# Patient Record
Sex: Female | Born: 1960
Health system: Southern US, Community
[De-identification: ages and names within clinical notes are randomized; demographics above are authoritative.]

## PROBLEM LIST (undated history)

## (undated) DIAGNOSIS — J302 Other seasonal allergic rhinitis: Secondary | ICD-10-CM

## (undated) DIAGNOSIS — M353 Polymyalgia rheumatica: Secondary | ICD-10-CM

## (undated) HISTORY — PX: BREAST EXCISIONAL BIOPSY: SUR124

## (undated) HISTORY — DX: Other seasonal allergic rhinitis: J30.2

## (undated) HISTORY — DX: Polymyalgia rheumatica: M35.3

---

## 1980-10-21 HISTORY — PX: RIGHT OOPHORECTOMY: SHX2359

## 1991-10-22 HISTORY — PX: ECTOPIC PREGNANCY SURGERY: SHX613

## 1991-10-22 HISTORY — PX: ABLATION ON ENDOMETRIOSIS: SHX5787

## 1998-10-21 HISTORY — PX: BREAST EXCISIONAL BIOPSY: SUR124

## 1999-01-01 ENCOUNTER — Ambulatory Visit (HOSPITAL_COMMUNITY): Admission: RE | Admit: 1999-01-01 | Discharge: 1999-01-01 | Payer: Self-pay | Admitting: *Deleted

## 1999-11-05 ENCOUNTER — Ambulatory Visit (HOSPITAL_BASED_OUTPATIENT_CLINIC_OR_DEPARTMENT_OTHER): Admission: RE | Admit: 1999-11-05 | Discharge: 1999-11-05 | Payer: Self-pay | Admitting: *Deleted

## 1999-12-14 ENCOUNTER — Other Ambulatory Visit: Admission: RE | Admit: 1999-12-14 | Discharge: 1999-12-14 | Payer: Self-pay | Admitting: Obstetrics and Gynecology

## 2000-02-07 ENCOUNTER — Ambulatory Visit (HOSPITAL_COMMUNITY): Admission: RE | Admit: 2000-02-07 | Discharge: 2000-02-07 | Payer: Self-pay | Admitting: *Deleted

## 2000-05-09 ENCOUNTER — Encounter: Admission: RE | Admit: 2000-05-09 | Discharge: 2000-06-09 | Payer: Self-pay | Admitting: Sports Medicine

## 2000-08-14 ENCOUNTER — Encounter: Admission: RE | Admit: 2000-08-14 | Discharge: 2000-09-25 | Payer: Self-pay | Admitting: Sports Medicine

## 2002-08-31 ENCOUNTER — Ambulatory Visit (HOSPITAL_COMMUNITY): Admission: RE | Admit: 2002-08-31 | Discharge: 2002-08-31 | Payer: Self-pay | Admitting: *Deleted

## 2004-01-20 ENCOUNTER — Ambulatory Visit (HOSPITAL_COMMUNITY): Admission: RE | Admit: 2004-01-20 | Discharge: 2004-01-20 | Payer: Self-pay | Admitting: Obstetrics and Gynecology

## 2004-01-25 ENCOUNTER — Encounter: Admission: RE | Admit: 2004-01-25 | Discharge: 2004-01-25 | Payer: Self-pay | Admitting: Obstetrics and Gynecology

## 2004-03-30 ENCOUNTER — Ambulatory Visit (HOSPITAL_COMMUNITY): Admission: RE | Admit: 2004-03-30 | Discharge: 2004-03-30 | Payer: Self-pay | Admitting: Internal Medicine

## 2004-03-30 HISTORY — PX: COLONOSCOPY: SHX174

## 2005-08-05 ENCOUNTER — Ambulatory Visit (HOSPITAL_COMMUNITY): Admission: RE | Admit: 2005-08-05 | Discharge: 2005-08-05 | Payer: Self-pay | Admitting: Obstetrics and Gynecology

## 2006-09-01 ENCOUNTER — Ambulatory Visit (HOSPITAL_COMMUNITY): Admission: RE | Admit: 2006-09-01 | Discharge: 2006-09-01 | Payer: Self-pay | Admitting: Obstetrics and Gynecology

## 2007-08-17 ENCOUNTER — Encounter: Admission: RE | Admit: 2007-08-17 | Discharge: 2007-08-17 | Payer: Self-pay | Admitting: Occupational Medicine

## 2007-09-01 ENCOUNTER — Encounter: Admission: RE | Admit: 2007-09-01 | Discharge: 2007-11-30 | Payer: Self-pay | Admitting: Occupational Medicine

## 2007-09-07 ENCOUNTER — Ambulatory Visit (HOSPITAL_COMMUNITY): Admission: RE | Admit: 2007-09-07 | Discharge: 2007-09-07 | Payer: Self-pay | Admitting: Sports Medicine

## 2007-09-30 ENCOUNTER — Ambulatory Visit (HOSPITAL_COMMUNITY): Admission: RE | Admit: 2007-09-30 | Discharge: 2007-09-30 | Payer: Self-pay | Admitting: Obstetrics and Gynecology

## 2007-10-08 ENCOUNTER — Encounter: Admission: RE | Admit: 2007-10-08 | Discharge: 2007-10-08 | Payer: Self-pay | Admitting: Obstetrics and Gynecology

## 2008-12-16 ENCOUNTER — Encounter: Admission: RE | Admit: 2008-12-16 | Discharge: 2008-12-16 | Payer: Self-pay | Admitting: Obstetrics and Gynecology

## 2010-10-05 ENCOUNTER — Ambulatory Visit (HOSPITAL_COMMUNITY)
Admission: RE | Admit: 2010-10-05 | Discharge: 2010-10-05 | Payer: Self-pay | Source: Home / Self Care | Attending: Obstetrics and Gynecology | Admitting: Obstetrics and Gynecology

## 2010-11-11 ENCOUNTER — Encounter: Payer: Self-pay | Admitting: Obstetrics and Gynecology

## 2011-03-08 NOTE — Op Note (Signed)
Gabrielle Chase, GODETTE                     ACCOUNT NO.:  [id number]   MEDICAL RECORD NO.:  [id number]                   PATIENT TYPE:  AMB   LOCATION:  DAY                                  FACILITY:  [facility]   PHYSICIAN:  R. Roetta Sessions, M.D.              DATE OF BIRTH:  11-01-1960   DATE OF PROCEDURE:  03/30/2004  DATE OF DISCHARGE:                                 OPERATIVE REPORT   PROCEDURE:  Screening colonoscopy.   INDICATIONS FOR PROCEDURE:  The patient is a 50 year old lady, who has a  positive family history of colorectal cancer in her mother, who was  diagnosed at age 45.  She is devoid of any lower GI tract symptoms.  She has  occasional left lower quadrant abdominal pain.  Colonoscopy is now being  done as a high risk screening maneuver.  This approach has been discussed  with the patient at length.  Potential risks, benefits, and alternatives  have been reviewed.  Please see my handwritten H&P.   PROCEDURE NOTE:  O2 saturations, blood pressure, pulse, and respirations  were monitored throughout the entire procedure.   CONSCIOUS SEDATION:  1. Versed 6 mg IV.  2. Demerol 125 mg IV in divided doses.   INSTRUMENTS:  The Olympus video system.   FINDINGS:  Digital rectal exam revealed no abnormalities.  __________.  The  prep was adequate,   RECTUM:  Examination of rectal mucosa including retroflexed view of the anal  verge revealed no abnormalities.   COLON:  Colonic mucosa was surveyed from the rectosigmoid junction through  the left transverse and right colon to the area of the appendiceal orifice,  ileocecal valve, and cecum.  These structures were well-seen, photographed  for the record.  From this level, the scope was slowly withdrawn.  All  previously __________ mucosal surfaces were again.  It is notable that  initially advancing the scope to the cecum, the left colon was somewhat  noncompliant.  In fact, I was unable to advance across this segment of the  colon with the adult scope.  I withdrew it, obtained a pediatric colonoscope  and was able to make it around and back without much difficulty.  The  colonic mucosa appeared entirely normal.  The patient tolerated the  procedure well and was reacted in endoscopy.   IMPRESSION:  1. Normal rectum.  2. Somewhat noncompliant left colon.  3. Otherwise, normal colonic mucosa.   RECOMMENDATIONS:  Repeat colonoscopy 5 years.      ___________________________________________                                            Jonathon Bellows, M.D.   RMR/MEDQ  D:  03/30/2004  T:  03/30/2004  Job:  [id number]   cc:   Malachi Pro. Ambrose Mantle, M.D.  [address]  [place]  [place] [address]  Fax: [phone]   Elana Alm. Nicholos Johns, M.D.  [address]  [place]  [place] [address]  Fax: [phone]

## 2011-10-23 ENCOUNTER — Other Ambulatory Visit (HOSPITAL_COMMUNITY): Payer: Self-pay | Admitting: Family Medicine

## 2011-10-23 DIAGNOSIS — Z1231 Encounter for screening mammogram for malignant neoplasm of breast: Secondary | ICD-10-CM

## 2011-11-19 ENCOUNTER — Ambulatory Visit (HOSPITAL_COMMUNITY): Payer: Self-pay

## 2011-11-25 ENCOUNTER — Ambulatory Visit (HOSPITAL_COMMUNITY)
Admission: RE | Admit: 2011-11-25 | Discharge: 2011-11-25 | Disposition: A | Discharge status: Home / Self Care | Payer: 59 | Source: Ambulatory Visit | Attending: Family Medicine | Admitting: Family Medicine

## 2011-11-25 DIAGNOSIS — Z1231 Encounter for screening mammogram for malignant neoplasm of breast: Secondary | ICD-10-CM | POA: Insufficient documentation

## 2011-12-03 ENCOUNTER — Other Ambulatory Visit: Payer: Self-pay | Admitting: Family Medicine

## 2011-12-03 DIAGNOSIS — R928 Other abnormal and inconclusive findings on diagnostic imaging of breast: Secondary | ICD-10-CM

## 2011-12-10 ENCOUNTER — Ambulatory Visit
Admission: RE | Admit: 2011-12-10 | Discharge: 2011-12-10 | Disposition: A | Discharge status: Home / Self Care | Payer: 59 | Source: Ambulatory Visit | Attending: Family Medicine | Admitting: Family Medicine

## 2011-12-10 DIAGNOSIS — R928 Other abnormal and inconclusive findings on diagnostic imaging of breast: Secondary | ICD-10-CM

## 2012-02-07 ENCOUNTER — Encounter: Payer: Self-pay | Admitting: Internal Medicine

## 2012-02-10 ENCOUNTER — Ambulatory Visit: Payer: 59 | Admitting: Gastroenterology

## 2012-05-18 ENCOUNTER — Other Ambulatory Visit: Payer: Self-pay | Admitting: Family Medicine

## 2012-05-18 DIAGNOSIS — N6489 Other specified disorders of breast: Secondary | ICD-10-CM

## 2012-06-03 ENCOUNTER — Ambulatory Visit
Admission: RE | Admit: 2012-06-03 | Discharge: 2012-06-03 | Disposition: A | Discharge status: Home / Self Care | Payer: 59 | Source: Ambulatory Visit | Attending: Family Medicine | Admitting: Family Medicine

## 2012-06-03 DIAGNOSIS — N6489 Other specified disorders of breast: Secondary | ICD-10-CM

## 2012-12-28 ENCOUNTER — Other Ambulatory Visit: Payer: Self-pay | Admitting: Family Medicine

## 2012-12-28 DIAGNOSIS — N6489 Other specified disorders of breast: Secondary | ICD-10-CM

## 2013-01-06 ENCOUNTER — Other Ambulatory Visit: Payer: Self-pay | Admitting: Family Medicine

## 2013-01-06 ENCOUNTER — Ambulatory Visit
Admission: RE | Admit: 2013-01-06 | Discharge: 2013-01-06 | Disposition: A | Discharge status: Home / Self Care | Payer: 59 | Source: Ambulatory Visit | Attending: Family Medicine | Admitting: Family Medicine

## 2013-01-06 DIAGNOSIS — N6489 Other specified disorders of breast: Secondary | ICD-10-CM

## 2013-04-20 ENCOUNTER — Telehealth: Payer: Self-pay | Admitting: Internal Medicine

## 2013-04-20 NOTE — Telephone Encounter (Signed)
I transferred call to DS VM to see if patient needs to be triage vs coming into the office

## 2013-04-20 NOTE — Telephone Encounter (Signed)
Pt is scheduled OV with LL on 05/10/2013 at 10:00 AM for anemia before scheduling TCS.

## 2013-05-06 ENCOUNTER — Encounter: Payer: Self-pay | Admitting: Internal Medicine

## 2013-05-10 ENCOUNTER — Encounter: Payer: Self-pay | Admitting: Gastroenterology

## 2013-05-10 ENCOUNTER — Ambulatory Visit (INDEPENDENT_AMBULATORY_CARE_PROVIDER_SITE_OTHER): Payer: 59 | Admitting: Gastroenterology

## 2013-05-10 VITALS — BP 133/87 | HR 76 | Temp 97.4°F | Ht 70.0 in | Wt 206.6 lb

## 2013-05-10 DIAGNOSIS — D649 Anemia, unspecified: Secondary | ICD-10-CM

## 2013-05-10 DIAGNOSIS — Z800 Family history of malignant neoplasm of digestive organs: Secondary | ICD-10-CM

## 2013-05-10 MED ORDER — PEG 3350-KCL-NA BICARB-NACL 420 G PO SOLR: 4000.0000 mL | ORAL | Status: DC

## 2013-05-10 NOTE — Patient Instructions (Addendum)
1. We have scheduled you for a colonoscopy with Dr. Jena Gauss. Please see separate instructions. 2. As discussed today, if you want to we can recheck your CBC now or you can wait until you donate blood again. If you are unable to donate blood, please let me know and we will recheck your labs at that time.  3. Call with any questions or concerns.

## 2013-05-10 NOTE — Progress Notes (Signed)
Primary Care Physician:  Lolita Patella, MD  Primary Gastroenterologist:  Roetta Sessions, MD   Chief Complaint  Patient presents with  . Colonoscopy    HPI:  Gabrielle Chase is a 52 y.o. female here to schedule her high risk screening colonoscopy. Mother succumbed to colon cancer at age 68, based on circumstances and events at that time she likely had colon cancer prior to age 75.  Patient's last TCS was in 2005 as outlined below.   Patient states she recently had blood work towards the end of June that showed she had slight anemia. We were able to retrieve a copy of her labs while she was here in the office. Her hemoglobin was 11.4 with normal range 12-16. Her hematocrit was 33.7, MCV normal at 97.8. She states she had this blood work done when she presented with what was likely [facility] spotted fever. 3 weeks prior to this she had donated a unit of blood. States that she gives blood regularly and actually has been doing very well with maintaining her hemoglobin and hematocrit to do this. In the past she had had episodes of not "passing in being able to give blood".  No iron studies done. Treated with doxycycline for RMSF.   States she feels well. No melena, rectal bleeding, abdominal pain, vomiting, heartburn, dysphagia, unintentional weight loss. Appetite is good.  Current Outpatient Prescriptions  Medication Sig Dispense Refill  . cetirizine (ZYRTEC) 5 MG tablet Take 10 mg by mouth daily.      Marland Kitchen aspirin 81 MG tablet Take 81 mg by mouth daily.       No current facility-administered medications for this visit.    Allergies as of 05/10/2013  . (No Known Allergies)    Past Medical History  Diagnosis Date  . Seasonal allergies     Past Surgical History  Procedure Laterality Date  . Colonoscopy  03/30/2004    ZOX:WRUEAV rectum.Somewhat noncompliant left colon/Otherwise, normal colonic mucosa  . Right oophorectomy  1982    endometriosis  . Cesarean section  1989  .  Ablation on endometriosis  1993    Family History  Problem Relation Age of Onset  . Colon cancer Mother     diagnosed at age 31-60, deceased age 19    History   Social History  . Marital Status: Married    Spouse Name: N/A    Number of Children: 1  . Years of Education: N/A   Occupational History  . RN/STAFF EDUCATION    Social History Main Topics  . Smoking status: Never Smoker   . Smokeless tobacco: Not on file  . Alcohol Use: Yes     Comment: socially  . Drug Use: No  . Sexually Active: Not on file   Other Topics Concern  . Not on file   Social History Narrative  . No narrative on file      ROS:  General: Negative for anorexia, weight loss, fever, chills, fatigue, weakness. Eyes: Negative for vision changes.  ENT: Negative for hoarseness, difficulty swallowing , nasal congestion. CV: Negative for chest pain, angina, palpitations, dyspnea on exertion, peripheral edema.  Respiratory: Negative for dyspnea at rest, dyspnea on exertion, cough, sputum, wheezing.  GI: See history of present illness. GU:  Negative for dysuria, hematuria, urinary incontinence, urinary frequency, nocturnal urination.  MS: Negative for joint pain, low back pain.  Derm: Negative for rash or itching.  Neuro: Negative for weakness, abnormal sensation, seizure, frequent headaches, memory loss, confusion.  Psych:  Negative for anxiety, depression, suicidal ideation, hallucinations.  Endo: Negative for unusual weight change.  Heme: Negative for bruising or bleeding. Allergy: Negative for rash or hives.    Physical Examination:  BP 133/87  Pulse 76  Temp(Src) 97.4 F (36.3 C) (Oral)  Ht 5\' 10"  (1.778 m)  Wt 206 lb 9.6 oz (93.713 kg)  BMI 29.64 kg/m2   General: Well-nourished, well-developed in no acute distress.  Head: Normocephalic, atraumatic.   Eyes: Conjunctiva pink, no icterus. Mouth: Oropharyngeal mucosa moist and pink , no lesions erythema or exudate. Neck: Supple without  thyromegaly, masses, or lymphadenopathy.  Lungs: Clear to auscultation bilaterally.  Heart: Regular rate and rhythm, no murmurs rubs or gallops.  Abdomen: Bowel sounds are normal, nontender, nondistended, no hepatosplenomegaly or masses, no abdominal bruits or    hernia , no rebound or guarding.   Rectal: not performed Extremities: No lower extremity edema. No clubbing or deformities.  Neuro: Alert and oriented x 4 , grossly normal neurologically.  Skin: Warm and dry, no rash or jaundice.   Psych: Alert and cooperative, normal mood and affect.  Labs: Labs from 04/19/2013. White blood cell count 7100, hemoglobin 11.4, hematocrit 33.7, MCV 97.8, platelets 212,000, glucose 87, BUN 14, creatinine 0.86, sodium 143, potassium 4.1, calcium 9, total bilirubin 0.3, alkaline phosphatase 72, AST 26, ALT 24, albumin 4.1  Imaging Studies: No results found.

## 2013-05-10 NOTE — Progress Notes (Signed)
Cc PCP 

## 2013-05-10 NOTE — Assessment & Plan Note (Signed)
Due for her high-risk screening colonoscopy given family history of colon cancer, mother at age 54-60. Her last colonoscopy in 2005. Recently blood work showed mild normocytic anemia which I believe is secondary to blood donation. Donated a unit of blood just 2-3 weeks prior to lab work being done. I discussed options with patient today including repeating her CBC now versus following up with [place] if she is unable to donate blood as planned in the next month or so. She would prefer to call as if she is unable to donate blood due to low hematocrit and at that time we would order further labs.  Colonoscopy in the near future with Dr. Jena Gauss.  I have discussed the risks, alternatives, benefits with regards to but not limited to the risk of reaction to medication, bleeding, infection, perforation and the patient is agreeable to proceed. Written consent to be obtained.

## 2013-05-14 LAB — CBC
HCT: 34 %
MCV: 97.8 fL
platelet count: 212

## 2013-05-14 LAB — COMPREHENSIVE METABOLIC PANEL
AST: 26 U/L
Alkaline Phosphatase: 72 U/L
BUN: 14 mg/dL (ref 4–21)
Calcium: 9 mg/dL

## 2013-05-19 ENCOUNTER — Encounter (HOSPITAL_COMMUNITY): Payer: Self-pay | Admitting: Pharmacy Technician

## 2013-05-31 ENCOUNTER — Encounter (HOSPITAL_COMMUNITY)
Admission: RE | Disposition: A | Discharge status: Home Health | Payer: Self-pay | Source: Ambulatory Visit | Attending: Internal Medicine

## 2013-05-31 ENCOUNTER — Encounter (HOSPITAL_COMMUNITY): Payer: Self-pay | Admitting: *Deleted

## 2013-05-31 ENCOUNTER — Ambulatory Visit (HOSPITAL_COMMUNITY)
Admission: RE | Admit: 2013-05-31 | Discharge: 2013-05-31 | Disposition: A | Discharge status: Home Health | Payer: 59 | Source: Ambulatory Visit | Attending: Internal Medicine | Admitting: Internal Medicine

## 2013-05-31 DIAGNOSIS — Z1211 Encounter for screening for malignant neoplasm of colon: Secondary | ICD-10-CM | POA: Insufficient documentation

## 2013-05-31 DIAGNOSIS — Z800 Family history of malignant neoplasm of digestive organs: Secondary | ICD-10-CM

## 2013-05-31 DIAGNOSIS — K648 Other hemorrhoids: Secondary | ICD-10-CM | POA: Insufficient documentation

## 2013-05-31 DIAGNOSIS — D649 Anemia, unspecified: Secondary | ICD-10-CM

## 2013-05-31 HISTORY — PX: COLONOSCOPY: SHX5424

## 2013-05-31 SURGERY — COLONOSCOPY
Anesthesia: Moderate Sedation

## 2013-05-31 MED ORDER — MEPERIDINE HCL 100 MG/ML IJ SOLN
INTRAMUSCULAR | Status: DC | PRN
Start: 2013-05-31 — End: 2013-05-31
  Administered 2013-05-31: 25 mg via INTRAVENOUS
  Administered 2013-05-31 (×2): 50 mg via INTRAVENOUS

## 2013-05-31 MED ORDER — STERILE WATER FOR IRRIGATION IR SOLN
Status: DC | PRN
  Administered 2013-05-31: 09:00:00

## 2013-05-31 MED ORDER — MIDAZOLAM HCL 5 MG/5ML IJ SOLN
INTRAMUSCULAR | Status: DC | PRN
  Administered 2013-05-31: 1 mg via INTRAVENOUS
  Administered 2013-05-31 (×2): 2 mg via INTRAVENOUS

## 2013-05-31 MED ORDER — ONDANSETRON HCL 4 MG/2ML IJ SOLN
INTRAMUSCULAR | Status: AC
  Filled 2013-05-31: qty 2

## 2013-05-31 MED ORDER — ONDANSETRON HCL 4 MG/2ML IJ SOLN
INTRAMUSCULAR | Status: DC | PRN
  Administered 2013-05-31: 4 mg via INTRAVENOUS

## 2013-05-31 MED ORDER — MEPERIDINE HCL 100 MG/ML IJ SOLN
INTRAMUSCULAR | Status: AC
  Filled 2013-05-31: qty 2

## 2013-05-31 MED ORDER — MIDAZOLAM HCL 5 MG/5ML IJ SOLN
INTRAMUSCULAR | Status: AC
  Filled 2013-05-31: qty 10

## 2013-05-31 MED ORDER — SODIUM CHLORIDE 0.9 % IV SOLN
INTRAVENOUS | Status: DC
  Administered 2013-05-31: 08:00:00 via INTRAVENOUS

## 2013-05-31 NOTE — Op Note (Signed)
[facility] [address] [place], [address]   COLONOSCOPY PROCEDURE REPORT  PATIENT: [place], Bojanowski  MR#:         [id number] BIRTHDATE: [birth year] , 52  yrs. old GENDER: Female ENDOSCOPIST: R.  Roetta Sessions, MD FACP [facility] REFERRED BY:  Elias Else, M.D. PROCEDURE DATE:  05/31/2013 PROCEDURE:     High-risk screening colonoscopy  INDICATIONS: Positive family history of colon cancer in a first-degree relatives  INFORMED CONSENT:  The risks, benefits, alternatives and imponderables including but not limited to bleeding, perforation as well as the possibility of a missed lesion have been reviewed.  The potential for biopsy, lesion removal, etc. have also been discussed.  Questions have been answered.  All parties agreeable. Please see the history and physical in the medical record for more information.  MEDICATIONS: Versed 5 mg IV and Demerol 125 mg IV in divided doses. Zofran 4 mg IV  DESCRIPTION OF PROCEDURE:  After a digital rectal exam was performed, the EC-[id number] ([id number])  colonoscope was advanced from the anus through the rectum and colon to the area of the cecum, ileocecal valve and appendiceal orifice.  The cecum was deeply intubated.  These structures were well-seen and photographed for the record.  From the level of the cecum and ileocecal valve, the scope was slowly and cautiously withdrawn.  The mucosal surfaces were carefully surveyed utilizing scope tip deflection to facilitate fold flattening as needed.  The scope was pulled down into the rectum where a thorough examination including retroflexion was performed.    FINDINGS:  Adequate preparation. Internal hemorrhoids; Otherwise, normal rectum; Somewhat noncompliant left colon; The colonic mucosa appeared normal.  THERAPEUTIC / DIAGNOSTIC MANEUVERS PERFORMED:  None  COMPLICATIONS: none  CECAL WITHDRAWAL TIME:  8 minutes  IMPRESSION:  Normal colonoscopy (internal  hemorrhoids)   RECOMMENDATIONS:  Repeat high-risk screening colonoscopy in 5 years   _______________________________ eSigned:  R. Roetta Sessions, MD FACP [facility] 05/31/2013 9:24 AM   CC:

## 2013-05-31 NOTE — Interval H&P Note (Signed)
History and Physical Interval Note:  05/31/2013 8:48 AM  Gabrielle Chase  has presented today for surgery, with the diagnosis of FAMILY HISTORY OF COLON RECTAL CANCER AND NORMOCYTIC ANEMIA  The various methods of treatment have been discussed with the patient and family. After consideration of risks, benefits and other options for treatment, the patient has consented to  Procedure(s) with comments: COLONOSCOPY (N/A) - 8:45 as a surgical intervention .  The patient's history has been reviewed, patient examined, no change in status, stable for surgery.  I have reviewed the patient's chart and labs.  Questions were answered to the patient's satisfaction.     Gabrielle Chase  No change. High risk screening colonoscopy per plan.The risks, benefits, limitations, alternatives and imponderables have been reviewed with the patient. Questions have been answered. All parties are agreeable.

## 2013-05-31 NOTE — H&P (View-Only) (Signed)
Primary Care Physician:  READE,ROBERT ALEXANDER, MD  Primary Gastroenterologist:  Michael Rourk, MD   Chief Complaint  Patient presents with  . Colonoscopy    HPI:  Gabrielle Chase is a 52 y.o. female here to schedule her high risk screening colonoscopy. Mother succumbed to colon cancer at age 61, based on circumstances and events at that time she likely had colon cancer prior to age 58.  Patient's last TCS was in 2005 as outlined below.   Patient states she recently had blood work towards the end of June that showed she had slight anemia. We were able to retrieve a copy of her labs while she was here in the office. Her hemoglobin was 11.4 with normal range 12-16. Her hematocrit was 33.7, MCV normal at 97.8. She states she had this blood work done when she presented with what was likely [place] spotted fever. 3 weeks prior to this she had donated a unit of blood. States that she gives blood regularly and actually has been doing very well with maintaining her hemoglobin and hematocrit to do this. In the past she had had episodes of not "passing in being able to give blood".  No iron studies done. Treated with doxycycline for RMSF.   States she feels well. No melena, rectal bleeding, abdominal pain, vomiting, heartburn, dysphagia, unintentional weight loss. Appetite is good.  Current Outpatient Prescriptions  Medication Sig Dispense Refill  . cetirizine (ZYRTEC) 5 MG tablet Take 10 mg by mouth daily.      . aspirin 81 MG tablet Take 81 mg by mouth daily.       No current facility-administered medications for this visit.    Allergies as of 05/10/2013  . (No Known Allergies)    Past Medical History  Diagnosis Date  . Seasonal allergies     Past Surgical History  Procedure Laterality Date  . Colonoscopy  03/30/2004    RMR:Normal rectum.Somewhat noncompliant left colon/Otherwise, normal colonic mucosa  . Right oophorectomy  1982    endometriosis  . Cesarean section  1989  .  Ablation on endometriosis  1993    Family History  Problem Relation Age of Onset  . Colon cancer Mother     diagnosed at age 59-60, deceased age 61    History   Social History  . Marital Status: Married    Spouse Name: N/A    Number of Children: 1  . Years of Education: N/A   Occupational History  . RN/STAFF EDUCATION    Social History Main Topics  . Smoking status: Never Smoker   . Smokeless tobacco: Not on file  . Alcohol Use: Yes     Comment: socially  . Drug Use: No  . Sexually Active: Not on file   Other Topics Concern  . Not on file   Social History Narrative  . No narrative on file      ROS:  General: Negative for anorexia, weight loss, fever, chills, fatigue, weakness. Eyes: Negative for vision changes.  ENT: Negative for hoarseness, difficulty swallowing , nasal congestion. CV: Negative for chest pain, angina, palpitations, dyspnea on exertion, peripheral edema.  Respiratory: Negative for dyspnea at rest, dyspnea on exertion, cough, sputum, wheezing.  GI: See history of present illness. GU:  Negative for dysuria, hematuria, urinary incontinence, urinary frequency, nocturnal urination.  MS: Negative for joint pain, low back pain.  Derm: Negative for rash or itching.  Neuro: Negative for weakness, abnormal sensation, seizure, frequent headaches, memory loss, confusion.  Psych:   Negative for anxiety, depression, suicidal ideation, hallucinations.  Endo: Negative for unusual weight change.  Heme: Negative for bruising or bleeding. Allergy: Negative for rash or hives.    Physical Examination:  BP 133/87  Pulse 76  Temp(Src) 97.4 F (36.3 C) (Oral)  Ht 5' 10" (1.778 m)  Wt 206 lb 9.6 oz (93.713 kg)  BMI 29.64 kg/m2   General: Well-nourished, well-developed in no acute distress.  Head: Normocephalic, atraumatic.   Eyes: Conjunctiva pink, no icterus. Mouth: Oropharyngeal mucosa moist and pink , no lesions erythema or exudate. Neck: Supple without  thyromegaly, masses, or lymphadenopathy.  Lungs: Clear to auscultation bilaterally.  Heart: Regular rate and rhythm, no murmurs rubs or gallops.  Abdomen: Bowel sounds are normal, nontender, nondistended, no hepatosplenomegaly or masses, no abdominal bruits or    hernia , no rebound or guarding.   Rectal: not performed Extremities: No lower extremity edema. No clubbing or deformities.  Neuro: Alert and oriented x 4 , grossly normal neurologically.  Skin: Warm and dry, no rash or jaundice.   Psych: Alert and cooperative, normal mood and affect.  Labs: Labs from 04/19/2013. White blood cell count 7100, hemoglobin 11.4, hematocrit 33.7, MCV 97.8, platelets 212,000, glucose 87, BUN 14, creatinine 0.86, sodium 143, potassium 4.1, calcium 9, total bilirubin 0.3, alkaline phosphatase 72, AST 26, ALT 24, albumin 4.1  Imaging Studies: No results found.    

## 2013-06-02 ENCOUNTER — Encounter (HOSPITAL_COMMUNITY): Payer: Self-pay | Admitting: Internal Medicine

## 2013-08-26 ENCOUNTER — Other Ambulatory Visit: Payer: Self-pay

## 2014-01-24 ENCOUNTER — Other Ambulatory Visit: Payer: Self-pay

## 2014-01-24 DIAGNOSIS — Z1231 Encounter for screening mammogram for malignant neoplasm of breast: Secondary | ICD-10-CM

## 2014-02-03 ENCOUNTER — Ambulatory Visit
Admission: RE | Admit: 2014-02-03 | Discharge: 2014-02-03 | Disposition: A | Discharge status: Home / Self Care | Payer: 59 | Source: Ambulatory Visit

## 2014-02-03 DIAGNOSIS — Z1231 Encounter for screening mammogram for malignant neoplasm of breast: Secondary | ICD-10-CM

## 2014-02-08 ENCOUNTER — Other Ambulatory Visit: Payer: Self-pay | Admitting: Family Medicine

## 2014-02-08 DIAGNOSIS — R928 Other abnormal and inconclusive findings on diagnostic imaging of breast: Secondary | ICD-10-CM

## 2014-02-17 ENCOUNTER — Ambulatory Visit
Admission: RE | Admit: 2014-02-17 | Discharge: 2014-02-17 | Disposition: A | Discharge status: Home / Self Care | Payer: 59 | Source: Ambulatory Visit | Attending: Family Medicine | Admitting: Family Medicine

## 2014-02-17 DIAGNOSIS — R928 Other abnormal and inconclusive findings on diagnostic imaging of breast: Secondary | ICD-10-CM

## 2015-03-01 ENCOUNTER — Other Ambulatory Visit: Payer: Self-pay

## 2015-03-01 DIAGNOSIS — Z1231 Encounter for screening mammogram for malignant neoplasm of breast: Secondary | ICD-10-CM

## 2015-03-21 ENCOUNTER — Ambulatory Visit: Payer: 59

## 2015-03-24 ENCOUNTER — Ambulatory Visit
Admission: RE | Admit: 2015-03-24 | Discharge: 2015-03-24 | Disposition: A | Discharge status: Home / Self Care | Payer: 59 | Source: Ambulatory Visit

## 2015-03-24 DIAGNOSIS — Z1231 Encounter for screening mammogram for malignant neoplasm of breast: Secondary | ICD-10-CM

## 2015-11-29 DIAGNOSIS — L03211 Cellulitis of face: Secondary | ICD-10-CM | POA: Diagnosis not present

## 2015-11-29 DIAGNOSIS — L309 Dermatitis, unspecified: Secondary | ICD-10-CM | POA: Diagnosis not present

## 2015-11-29 MED FILL — AMOX TR-K CLV 875-125 MG TA: 875-125 | 7 days supply | Qty: 14 | Fill #0

## 2015-11-29 MED FILL — TRIAMCINOLONE 0.1% CREAM: 0.1 | 8 days supply | Qty: 15 | Fill #0

## 2016-03-20 ENCOUNTER — Other Ambulatory Visit: Payer: Self-pay | Admitting: Family Medicine

## 2016-03-20 DIAGNOSIS — Z1231 Encounter for screening mammogram for malignant neoplasm of breast: Secondary | ICD-10-CM

## 2016-03-20 DIAGNOSIS — M25552 Pain in left hip: Secondary | ICD-10-CM | POA: Diagnosis not present

## 2016-04-04 ENCOUNTER — Ambulatory Visit: Payer: 59

## 2016-04-15 ENCOUNTER — Ambulatory Visit
Admission: RE | Admit: 2016-04-15 | Discharge: 2016-04-15 | Disposition: A | Discharge status: Home / Self Care | Payer: 59 | Source: Ambulatory Visit | Attending: Family Medicine | Admitting: Family Medicine

## 2016-04-15 DIAGNOSIS — Z1231 Encounter for screening mammogram for malignant neoplasm of breast: Secondary | ICD-10-CM

## 2016-08-13 DIAGNOSIS — M791 Myalgia: Secondary | ICD-10-CM | POA: Diagnosis not present

## 2016-08-13 DIAGNOSIS — M25652 Stiffness of left hip, not elsewhere classified: Secondary | ICD-10-CM | POA: Diagnosis not present

## 2016-08-13 DIAGNOSIS — M25651 Stiffness of right hip, not elsewhere classified: Secondary | ICD-10-CM | POA: Diagnosis not present

## 2016-08-13 DIAGNOSIS — M542 Cervicalgia: Secondary | ICD-10-CM | POA: Diagnosis not present

## 2016-08-13 MED FILL — predniSONE 20 MG TABS: 20 | 5 days supply | Qty: 5 | Fill #0

## 2016-09-18 DIAGNOSIS — Z0000 Encounter for general adult medical examination without abnormal findings: Secondary | ICD-10-CM | POA: Diagnosis not present

## 2016-09-18 DIAGNOSIS — Z13220 Encounter for screening for lipoid disorders: Secondary | ICD-10-CM | POA: Diagnosis not present

## 2016-09-18 DIAGNOSIS — M2550 Pain in unspecified joint: Secondary | ICD-10-CM | POA: Diagnosis not present

## 2016-09-18 DIAGNOSIS — R030 Elevated blood-pressure reading, without diagnosis of hypertension: Secondary | ICD-10-CM | POA: Diagnosis not present

## 2016-09-18 MED FILL — MELOXICAM 15 MG TABLET: 15 | 30 days supply | Qty: 30 | Fill #0

## 2016-09-24 MED FILL — NAPROXEN 500 MG TABLET: 500 | 30 days supply | Qty: 60 | Fill #0

## 2016-10-25 DIAGNOSIS — M353 Polymyalgia rheumatica: Secondary | ICD-10-CM | POA: Diagnosis not present

## 2016-10-25 DIAGNOSIS — M25562 Pain in left knee: Secondary | ICD-10-CM | POA: Diagnosis not present

## 2016-10-25 DIAGNOSIS — M25461 Effusion, right knee: Secondary | ICD-10-CM | POA: Diagnosis not present

## 2016-10-25 DIAGNOSIS — M1990 Unspecified osteoarthritis, unspecified site: Secondary | ICD-10-CM | POA: Diagnosis not present

## 2016-10-25 DIAGNOSIS — M25469 Effusion, unspecified knee: Secondary | ICD-10-CM | POA: Diagnosis not present

## 2016-10-25 MED FILL — predniSONE 10 MG TABS: 10 | 30 days supply | Qty: 90 | Fill #0

## 2016-11-04 DIAGNOSIS — M353 Polymyalgia rheumatica: Secondary | ICD-10-CM | POA: Diagnosis not present

## 2016-11-04 DIAGNOSIS — M1990 Unspecified osteoarthritis, unspecified site: Secondary | ICD-10-CM | POA: Diagnosis not present

## 2016-11-28 MED FILL — predniSONE 10 MG TABS: 10 | 30 days supply | Qty: 90 | Fill #0

## 2016-12-16 DIAGNOSIS — M353 Polymyalgia rheumatica: Secondary | ICD-10-CM | POA: Diagnosis not present

## 2016-12-16 DIAGNOSIS — M1990 Unspecified osteoarthritis, unspecified site: Secondary | ICD-10-CM | POA: Diagnosis not present

## 2017-02-27 MED FILL — predniSONE 10 MG TABS: 10 | 30 days supply | Qty: 90 | Fill #0

## 2017-03-28 DIAGNOSIS — M1990 Unspecified osteoarthritis, unspecified site: Secondary | ICD-10-CM | POA: Diagnosis not present

## 2017-03-28 DIAGNOSIS — Z7952 Long term (current) use of systemic steroids: Secondary | ICD-10-CM | POA: Diagnosis not present

## 2017-03-28 DIAGNOSIS — M353 Polymyalgia rheumatica: Secondary | ICD-10-CM | POA: Diagnosis not present

## 2017-04-16 DIAGNOSIS — R112 Nausea with vomiting, unspecified: Secondary | ICD-10-CM | POA: Diagnosis not present

## 2017-04-16 DIAGNOSIS — Z79899 Other long term (current) drug therapy: Secondary | ICD-10-CM | POA: Diagnosis not present

## 2017-04-16 DIAGNOSIS — R197 Diarrhea, unspecified: Secondary | ICD-10-CM | POA: Diagnosis not present

## 2017-04-16 DIAGNOSIS — M353 Polymyalgia rheumatica: Secondary | ICD-10-CM | POA: Diagnosis not present

## 2017-04-18 ENCOUNTER — Telehealth: Payer: 59 | Admitting: Family

## 2017-04-18 DIAGNOSIS — N390 Urinary tract infection, site not specified: Secondary | ICD-10-CM | POA: Diagnosis not present

## 2017-04-18 MED ORDER — CEPHALEXIN 500 MG PO CAPS: 500.0000 mg | ORAL_CAPSULE | Freq: Two times a day (BID) | ORAL | 0 refills | Status: DC

## 2017-04-18 NOTE — Progress Notes (Signed)
Thank you for the details you put in the comment boxes. Those details really help [place] take better care of you. Regarding the cranberry juice, please stop drinking that for the UTI. It will not help you. You would need more than 2 gallons per day to get an adequate amount of the substances that can help with the UTI. This is a common myth that it will help; it will only result in high blood sugar and reflux from excessive acid in the juice.   We are sorry that you are not feeling well.  Here is how we plan to help!  Based on what you shared with me it looks like you most likely have a simple urinary tract infection.  A UTI (Urinary Tract Infection) is a bacterial infection of the bladder.  Most cases of urinary tract infections are simple to treat but a key part of your care is to encourage you to drink plenty of fluids and watch your symptoms carefully.  I have prescribed Keflex 500 mg twice a day for 7 days.  Your symptoms should gradually improve. Call us if the burning in your urine worsens, you develop worsening fever, back pain or pelvic pain or if your symptoms do not resolve after completing the antibiotic.  Urinary tract infections can be prevented by drinking plenty of water to keep your body hydrated.  Also be sure when you wipe, wipe from front to back and don't hold it in!  If possible, empty your bladder every 4 hours.  Your e-visit answers were reviewed by a board certified advanced clinical practitioner to complete your personal care plan.  Depending on the condition, your plan could have included both over the counter or prescription medications.  If there is a problem please reply  once you have received a response from your provider.  Your safety is important to [place].  If you have drug allergies check your prescription carefully.    You can use MyChart to ask questions about today's visit, request a non-urgent call back, or ask for a work or school excuse for 24 hours related to  this e-Visit. If it has been greater than 24 hours you will need to follow up with your provider, or enter a new e-Visit to address those concerns.   You will get an e-mail in the next two days asking about your experience.  I hope that your e-visit has been valuable and will speed your recovery. Thank you for using e-visits.

## 2017-05-07 ENCOUNTER — Other Ambulatory Visit: Payer: Self-pay | Admitting: Family Medicine

## 2017-05-07 DIAGNOSIS — Z1231 Encounter for screening mammogram for malignant neoplasm of breast: Secondary | ICD-10-CM

## 2017-05-16 ENCOUNTER — Ambulatory Visit
Admission: RE | Admit: 2017-05-16 | Discharge: 2017-05-16 | Disposition: A | Discharge status: Home / Self Care | Payer: 59 | Source: Ambulatory Visit | Attending: Family Medicine | Admitting: Family Medicine

## 2017-05-16 DIAGNOSIS — Z1231 Encounter for screening mammogram for malignant neoplasm of breast: Secondary | ICD-10-CM | POA: Diagnosis not present

## 2017-05-22 DIAGNOSIS — R0683 Snoring: Secondary | ICD-10-CM | POA: Diagnosis not present

## 2017-05-22 DIAGNOSIS — G4733 Obstructive sleep apnea (adult) (pediatric): Secondary | ICD-10-CM | POA: Diagnosis not present

## 2017-05-29 DIAGNOSIS — H43811 Vitreous degeneration, right eye: Secondary | ICD-10-CM | POA: Diagnosis not present

## 2017-07-01 DIAGNOSIS — M1990 Unspecified osteoarthritis, unspecified site: Secondary | ICD-10-CM | POA: Diagnosis not present

## 2017-07-01 DIAGNOSIS — M353 Polymyalgia rheumatica: Secondary | ICD-10-CM | POA: Diagnosis not present

## 2017-07-01 DIAGNOSIS — Z7952 Long term (current) use of systemic steroids: Secondary | ICD-10-CM | POA: Diagnosis not present

## 2017-07-01 MED FILL — predniSONE 5 MG TABS: 5 | 30 days supply | Qty: 30 | Fill #0

## 2017-07-30 DIAGNOSIS — M353 Polymyalgia rheumatica: Secondary | ICD-10-CM | POA: Diagnosis not present

## 2017-07-30 DIAGNOSIS — Z7952 Long term (current) use of systemic steroids: Secondary | ICD-10-CM | POA: Diagnosis not present

## 2017-08-04 MED FILL — predniSONE 5 MG TABS: 5 | 30 days supply | Qty: 30 | Fill #1

## 2017-09-16 MED FILL — predniSONE 5 MG TABS: 5 | 30 days supply | Qty: 30 | Fill #2

## 2017-10-01 DIAGNOSIS — D225 Melanocytic nevi of trunk: Secondary | ICD-10-CM | POA: Diagnosis not present

## 2017-10-01 DIAGNOSIS — L570 Actinic keratosis: Secondary | ICD-10-CM | POA: Diagnosis not present

## 2017-10-01 DIAGNOSIS — D2271 Melanocytic nevi of right lower limb, including hip: Secondary | ICD-10-CM | POA: Diagnosis not present

## 2017-10-01 DIAGNOSIS — L821 Other seborrheic keratosis: Secondary | ICD-10-CM | POA: Diagnosis not present

## 2017-10-01 DIAGNOSIS — D2262 Melanocytic nevi of left upper limb, including shoulder: Secondary | ICD-10-CM | POA: Diagnosis not present

## 2017-10-27 MED FILL — predniSONE 5 MG TABS: 5 | 30 days supply | Qty: 30 | Fill #3

## 2017-11-10 DIAGNOSIS — M25511 Pain in right shoulder: Secondary | ICD-10-CM | POA: Diagnosis not present

## 2017-11-13 DIAGNOSIS — R768 Other specified abnormal immunological findings in serum: Secondary | ICD-10-CM | POA: Diagnosis not present

## 2017-11-13 DIAGNOSIS — M1990 Unspecified osteoarthritis, unspecified site: Secondary | ICD-10-CM | POA: Diagnosis not present

## 2017-11-13 DIAGNOSIS — M353 Polymyalgia rheumatica: Secondary | ICD-10-CM | POA: Diagnosis not present

## 2017-11-13 DIAGNOSIS — Z7952 Long term (current) use of systemic steroids: Secondary | ICD-10-CM | POA: Diagnosis not present

## 2017-11-13 MED FILL — predniSONE 1 MG TABS: 1 | 90 days supply | Qty: 360 | Fill #0

## 2017-12-30 DIAGNOSIS — M779 Enthesopathy, unspecified: Secondary | ICD-10-CM | POA: Diagnosis not present

## 2017-12-30 DIAGNOSIS — M79601 Pain in right arm: Secondary | ICD-10-CM | POA: Diagnosis not present

## 2017-12-30 DIAGNOSIS — R768 Other specified abnormal immunological findings in serum: Secondary | ICD-10-CM | POA: Diagnosis not present

## 2017-12-30 DIAGNOSIS — Z7952 Long term (current) use of systemic steroids: Secondary | ICD-10-CM | POA: Diagnosis not present

## 2017-12-30 DIAGNOSIS — M1990 Unspecified osteoarthritis, unspecified site: Secondary | ICD-10-CM | POA: Diagnosis not present

## 2017-12-30 DIAGNOSIS — M353 Polymyalgia rheumatica: Secondary | ICD-10-CM | POA: Diagnosis not present

## 2017-12-30 MED FILL — NAPROXEN 500 MG TABLET: 500 | 30 days supply | Qty: 60 | Fill #0

## 2018-01-23 ENCOUNTER — Ambulatory Visit: Payer: 59 | Attending: Rheumatology | Admitting: Physical Therapy

## 2018-01-23 ENCOUNTER — Encounter: Payer: Self-pay | Admitting: Physical Therapy

## 2018-01-23 ENCOUNTER — Other Ambulatory Visit: Payer: Self-pay

## 2018-01-23 DIAGNOSIS — M25611 Stiffness of right shoulder, not elsewhere classified: Secondary | ICD-10-CM | POA: Diagnosis not present

## 2018-01-23 DIAGNOSIS — Z01419 Encounter for gynecological examination (general) (routine) without abnormal findings: Secondary | ICD-10-CM | POA: Diagnosis not present

## 2018-01-23 DIAGNOSIS — G8929 Other chronic pain: Secondary | ICD-10-CM | POA: Diagnosis not present

## 2018-01-23 DIAGNOSIS — Z1389 Encounter for screening for other disorder: Secondary | ICD-10-CM | POA: Diagnosis not present

## 2018-01-23 DIAGNOSIS — Z124 Encounter for screening for malignant neoplasm of cervix: Secondary | ICD-10-CM | POA: Diagnosis not present

## 2018-01-23 DIAGNOSIS — M6281 Muscle weakness (generalized): Secondary | ICD-10-CM | POA: Diagnosis not present

## 2018-01-23 DIAGNOSIS — M25511 Pain in right shoulder: Secondary | ICD-10-CM | POA: Insufficient documentation

## 2018-01-23 DIAGNOSIS — Z130 Encounter for screening for diseases of the blood and blood-forming organs and certain disorders involving the immune mechanism: Secondary | ICD-10-CM | POA: Diagnosis not present

## 2018-01-23 NOTE — Therapy (Signed)
[place], [place], [address] Phone: [phone]   Fax:  [phone]  Physical Therapy Evaluation  Patient Details  Name: Gabrielle Chase MRN: [id number] Date of Birth: 09-26-1961 Referring Provider: Sabino Niemann  MD   Encounter Date: 01/23/2018  PT End of Session - 01/23/18 0938    Visit Number  1    Number of Visits  13    Date for PT Re-Evaluation  03/06/18    PT Start Time  0846    PT Stop Time  0932    PT Time Calculation (min)  46 min    Activity Tolerance  Patient tolerated treatment well    Behavior During Therapy  [facility] for tasks assessed/performed       Past Medical History:  Diagnosis Date  . Polymyalgia rheumatica ([place])   . Seasonal allergies     Past Surgical History:  Procedure Laterality Date  . ABLATION ON ENDOMETRIOSIS  1993  . BREAST EXCISIONAL BIOPSY    . CESAREAN SECTION  1989  . COLONOSCOPY  03/30/2004   ZWC:[id number] rectum.Somewhat noncompliant left colon/Otherwise, normal colonic mucosa  . COLONOSCOPY N/A 05/31/2013   Procedure: COLONOSCOPY;  Surgeon: Daneil Dolin, MD;  [facility];  Service: Endoscopy;  Laterality: N/A;  8:45  . RIGHT OOPHORECTOMY  1982   endometriosis    There were no vitals filed for this visit.   Subjective Assessment - 01/23/18 0854    Subjective  pt is a 57 y.o F with CC of R shoulder and upper arm pain pain that started January of 2019 with no specific MOI. and reports pain in the bicep initally when she went to put a jacket on, reports she was in [place] and held on to a trolly with the RUE and felt intense pain. reported it started improving with medication but had a re-excerbation in March with no specific onset. pain seems to be flucutaating depending on activity. reports no PMHx involving RUE.    Limitations  Lifting    How long can you sit comfortably?  unlimited     How long can you stand comfortably?  unlimited    How long  can you walk comfortably?  unlimited    Diagnostic tests  x-ray    Patient Stated Goals  return to original state, pain free    Currently in Pain?  Yes    Pain Score  0-No pain at worst 10/10, took medication at 6:30    Pain Location  Arm    Pain Orientation  Right    Pain Descriptors / Indicators  Sharp;Dull;Throbbing    Pain Type  Chronic pain    Pain Frequency  Constant    Aggravating Factors   lifting R arm, using the R arm, laying down     Pain Relieving Factors  medication, icing, heat     Effect of Pain on Daily Activities  limited RUE use         [facility] PT Assessment - 01/23/18 0854      Assessment   Medical Diagnosis  R shoulder pain    Referring Provider  Sabino Niemann  MD    Onset Date/Surgical Date  -- January 2019    Hand Dominance  Right    Next MD Visit  -- May 2019    Prior Therapy  yes      Precautions   Precaution Comments  no pushing or pulling of the RUE  Restrictions   Weight Bearing Restrictions  No      Balance Screen   Has the patient fallen in the past 6 months  No    Has the patient had a decrease in activity level because of a fear of falling?   No    Is the patient reluctant to leave their home because of a fear of falling?   No      Home Environment   Living Environment  Private residence    Living Arrangements  Spouse/significant other    Available Help at Discharge  Family;Available PRN/intermittently    Type of Home  House    Home Access  Level entry    Home Layout  Two level    Alternate Level Stairs-Number of Steps  15    Alternate Level Stairs-Rails  Right descending      Prior Function   Level of Independence  Independent with basic ADLs    Vocation  Full time employment RN    Vocation Requirements  sitting/ standing      Cognition   Overall Cognitive Status  Within Functional Limits for tasks assessed      Observation/Other Assessments   Focus on Therapeutic Outcomes (FOTO)   43% limited predicted 29% limited       Posture/Postural Control   Posture/Postural Control  Postural limitations    Postural Limitations  Rounded Shoulders;Forward head      ROM / Strength   AROM / PROM / Strength  AROM;Strength      AROM   AROM Assessment Site  Shoulder    Right/Left Shoulder  Right;Left    Right Shoulder Extension  28 Degrees 1-2/10 PDM    Right Shoulder Flexion  93 Degrees 3-4/10 PDM    Right Shoulder ABduction  48 Degrees 3-4/10 PDM    Right Shoulder Internal Rotation  50 Degrees 1-2/10 , assessed in neutral    Right Shoulder External Rotation  26 Degrees in nuetral  1-2/10    Left Shoulder Extension  51 Degrees    Left Shoulder Flexion  147 Degrees    Left Shoulder ABduction  121 Degrees    Left Shoulder Internal Rotation  58 Degrees assessed in 90/90    Left Shoulder External Rotation  71 Degrees assessed in 90/90      Strength   Overall Strength Comments  bicep brachi 4+/5 and brachioradialis, and brachialis 4+/5 no pain, soreness with coracobrachialis    Strength Assessment Site  Shoulder;Hand    Right/Left Shoulder  Right;Left    Right Shoulder Flexion  3/5 based on ROM    Right Shoulder Extension  3/5 based on ROM    Right Shoulder ABduction  3/5 based on ROM    Right Shoulder Internal Rotation  4/5 0/10 pain     Right Shoulder External Rotation  4-/5 increased pain 2/10    Left Shoulder Flexion  5/5    Left Shoulder Extension  5/5    Left Shoulder ABduction  5/5    Left Shoulder Internal Rotation  5/5    Left Shoulder External Rotation  5/5    Right Hand Grip (lbs)  44 44,41,47    Left Hand Grip (lbs)  55.3 57,55,54      Palpation   Palpation comment  TTP along proximal bicep and infraspinatus, and deltoid tubercle      Special Tests    Special Tests  Rotator Cuff Impingement    Rotator Cuff Impingment tests  Full Can test;Empty Can  test;Lift- off test;Hawkins- Kennedy test;other      Hawkins-Kennedy test   Findings  Positive    Side  Right      Lift-Off test   Findings   Negative      Empty Can test   Findings  Negative      Full Can test   Findings  Negative      other   Findings  Positive    Side  Right    Comments  scapular assist test                 Objective measurements completed on examination: See above findings.      [facility] [facility] Adult PT Treatment/Exercise - 01/23/18 0854      Shoulder Exercises: Stretch   Other Shoulder Stretches  3-way bicep stretching holidng 30 sec ea rep    Other Shoulder Stretches  upper trap stretching 2 x 30 sec              PT Education - 01/23/18 0937    Education provided  Yes    Education Details  evaluation findings, POC, goals, HEP with proper form/ rationale, anatomy of the area involved    Person(s) Educated  Patient    Methods  Explanation;Verbal cues    Comprehension  Verbalized understanding;Verbal cues required       PT Short Term Goals - 01/23/18 0943      PT SHORT TERM GOAL #1   Title  pt to be I with intial HEP    Time  3    Period  Weeks    Status  New    Target Date  03/06/18      PT SHORT TERM GOAL #2   Title  pt to verbalize/ demo proper posture and lifting mechanics/ techniques to prevent and reduce R shoulder pain    Time  3    Period  Weeks    Status  New    Target Date  02/13/18      PT SHORT TERM GOAL #3   Title  increase R grip strength by >/ 10# to demo imrpovement in shoulder function     Baseline  initally 44#    Time  3    Period  Weeks    Status  New    Target Date  03/06/18        PT Long Term Goals - 01/23/18 0945      PT LONG TERM GOAL #1   Title  increase R shoulder flexion/ abduction to >/= 120 degrees and IR/ER to >/= 60 degrees (assessed in 90/90) for functional mobility with </= 2/10 pain    Time  6    Period  Weeks    Status  New    Target Date  03/06/18      PT LONG TERM GOAL #2   Title  improve R shoulder strength to >/= 4+/5 in all planes to promote stability and assist with lifting and carrying activities     Time  6     Period  Weeks    Status  New    Target Date  03/06/18      PT LONG TERM GOAL #3   Title  pt to be abel to lift/ lower from to and from an overhead shelf >/= 10#, push/pull >/= 20# for functional strength required for ADLs with </= 2/10  pain    Time  6    Period  Weeks  Status  New    Target Date  03/06/18      PT LONG TERM GOAL #4   Title  increase FOTO score to </= 29% limited to demo improvement in function    Time  6    Period  Weeks    Status  New    Target Date  03/20/18      PT LONG TERM GOAL #5   Title  pt to be I with all HEP given as of last visit to maintain and progress current level of function     Time  6    Period  Weeks    Status  New    Target Date  03/06/18             Plan - 01/23/18 6546    Clinical Impression Statement  pt presents to OPPT with CC or R shoulder pain that started in january and has progressively worsened with no specific MOI. Limited shoulder mobility in all planes seconary to stiffness and pain, limited strength with pain during testings. TTP along the proximal bicep tendon and noted soreness during MMT of the coracobrachialis. she would benefit from physical therapy to reduce R shoulder pain, improve mobility and strength, to maximixe her function and improve QOL by addressing the deficits listed    Clinical Presentation  Stable    Clinical Decision Making  Low    Rehab Potential  Good    PT Frequency  2x / week    PT Duration  6 weeks    PT Treatment/Interventions  ADLs/Self Care Home Management;Cryotherapy;Electrical Stimulation;Iontophoresis 4mg /ml Dexamethasone;Moist Heat;Ultrasound;Therapeutic activities;Therapeutic exercise;Dry needling;Taping;Manual techniques;Passive range of motion;Neuromuscular re-education    PT Next Visit Plan  review/ update HEP, discuss DN, shoulder mobs, PROM/ AAROM, posture, modalities for pain PRN    PT Home Exercise Plan  upper trap stretch, wand flexion/ abduction, and 3-way bicep stretching     Consulted and Agree with Plan of Care  Patient       Patient will benefit from skilled therapeutic intervention in order to improve the following deficits and impairments:  Pain, Increased fascial restricitons, Decreased strength, Decreased range of motion, Decreased activity tolerance, Decreased endurance, Postural dysfunction, Improper body mechanics, Impaired UE functional use  Visit Diagnosis: Chronic right shoulder pain  Stiffness of right shoulder, not elsewhere classified  Muscle weakness (generalized)     Problem List Patient Active Problem List   Diagnosis Date Noted  . Normocytic anemia 05/10/2013  . FH: colon cancer 05/10/2013   Starr Lake PT, DPT, LAT, ATC  01/23/18  9:48 AM      [facility] [address] [place], [place], [address] Phone: [phone]   Fax:  [phone]  Name: Gabrielle Chase MRN: [id number] Date of Birth: 1961/06/19

## 2018-01-27 ENCOUNTER — Ambulatory Visit: Payer: 59 | Admitting: Physical Therapy

## 2018-01-27 ENCOUNTER — Encounter: Payer: Self-pay | Admitting: Physical Therapy

## 2018-01-27 DIAGNOSIS — G8929 Other chronic pain: Secondary | ICD-10-CM

## 2018-01-27 DIAGNOSIS — M6281 Muscle weakness (generalized): Secondary | ICD-10-CM

## 2018-01-27 DIAGNOSIS — M25511 Pain in right shoulder: Principal | ICD-10-CM

## 2018-01-27 DIAGNOSIS — M25611 Stiffness of right shoulder, not elsewhere classified: Secondary | ICD-10-CM

## 2018-01-27 NOTE — Therapy (Signed)
[place], [place], [address] Phone: [phone]   Fax:  [phone]  Physical Therapy Treatment  Patient Details  Name: Gabrielle Chase MRN: [id number] Date of Birth: April 14, 1961 Referring Provider: Sabino Niemann  MD   Encounter Date: 01/27/2018  PT End of Session - 01/27/18 0801    Visit Number  2    Number of Visits  13    Date for PT Re-Evaluation  03/06/18    PT Start Time  0801    PT Stop Time  0852    PT Time Calculation (min)  51 min    Activity Tolerance  Patient tolerated treatment well    Behavior During Therapy  [facility] for tasks assessed/performed       Past Medical History:  Diagnosis Date  . Polymyalgia rheumatica ([place])   . Seasonal allergies     Past Surgical History:  Procedure Laterality Date  . ABLATION ON ENDOMETRIOSIS  1993  . BREAST EXCISIONAL BIOPSY    . CESAREAN SECTION  1989  . COLONOSCOPY  03/30/2004   GEZ:[id number] rectum.Somewhat noncompliant left colon/Otherwise, normal colonic mucosa  . COLONOSCOPY N/A 05/31/2013   Procedure: COLONOSCOPY;  Surgeon: Daneil Dolin, MD;  [facility];  Service: Endoscopy;  Laterality: N/A;  8:45  . RIGHT OOPHORECTOMY  1982   endometriosis    There were no vitals filed for this visit.  Subjective Assessment - 01/27/18 0803    Subjective  "I am doing all my exercises and i feel like i am able reach farther. but the soreness is still there"    Currently in Pain?  Yes    Pain Score  1  reaching 2-3/10    Pain Orientation  Right    Pain Descriptors / Indicators  Aching    Pain Type  Chronic pain    Pain Onset  More than a month ago    Pain Frequency  Intermittent                       [facility] Adult PT Treatment/Exercise - 01/27/18 0829      Shoulder Exercises: Seated   Other Seated Exercises  seated lower  trap strengthening with elbows on bolster with yellow band     Other Seated Exercises  flexion scaption  angle 1 x 10      Shoulder Exercises: Prone   Other Prone Exercises  ceiling punches 2 x 15      Shoulder Exercises: ROM/Strengthening   Other ROM/Strengthening Exercises  wall slides flexion combined with scapular upward assist      Shoulder Exercises: Stretch   Other Shoulder Stretches  3-way bicep stretching holidng 30 sec ea rep    Other Shoulder Stretches  rhomboid stretch 2 x 30 sec with hands clasped      Moist Heat Therapy   Number Minutes Moist Heat  10 Minutes    Moist Heat Location  Shoulder bicep on the R      Manual Therapy   Manual Therapy  Soft tissue mobilization;Joint mobilization;Passive ROM;Scapular mobilization    Manual therapy comments  skilled palpation and monitoring throughout TPDN    Joint Mobilization  inferior/ PA and AP glides grade 3    Soft tissue mobilization  IASTM over the proximal biceps    Scapular Mobilization  upward scapular assist with active wall slides into flexion    Passive ROM  Flexion with intermittent distraction working into end ranges  Trigger Point Dry Needling - 01/27/18 7510    Consent Given?  Yes    Education Handout Provided  Yes    Muscles Treated Upper Body  -- biceps brachii           PT Education - 01/27/18 0843    Education provided  Yes    Education Details  reviewed previously provided HEP, muscle anatomy and referral patterns. What TPDN is, benefits, what to epect and after care.    Person(s) Educated  Patient    Methods  Explanation;Verbal cues;Handout;Demonstration    Comprehension  Verbalized understanding;Verbal cues required;Returned demonstration       PT Short Term Goals - 01/23/18 0943      PT SHORT TERM GOAL #1   Title  pt to be I with intial HEP    Time  3    Period  Weeks    Status  New    Target Date  03/06/18      PT SHORT TERM GOAL #2   Title  pt to verbalize/ demo proper posture and lifting mechanics/ techniques to prevent and reduce R shoulder pain    Time  3    Period  Weeks     Status  New    Target Date  02/13/18      PT SHORT TERM GOAL #3   Title  increase R grip strength by >/ 10# to demo imrpovement in shoulder function     Baseline  initally 44#    Time  3    Period  Weeks    Status  New    Target Date  03/06/18        PT Long Term Goals - 01/23/18 0945      PT LONG TERM GOAL #1   Title  increase R shoulder flexion/ abduction to >/= 120 degrees and IR/ER to >/= 60 degrees (assessed in 90/90) for functional mobility with </= 2/10 pain    Time  6    Period  Weeks    Status  New    Target Date  03/06/18      PT LONG TERM GOAL #2   Title  improve R shoulder strength to >/= 4+/5 in all planes to promote stability and assist with lifting and carrying activities     Time  6    Period  Weeks    Status  New    Target Date  03/06/18      PT LONG TERM GOAL #3   Title  pt to be abel to lift/ lower from to and from an overhead shelf >/= 10#, push/pull >/= 20# for functional strength required for ADLs with </= 2/10  pain    Time  6    Period  Weeks    Status  New    Target Date  03/06/18      PT LONG TERM GOAL #4   Title  increase FOTO score to </= 29% limited to demo improvement in function    Time  6    Period  Weeks    Status  New    Target Date  03/20/18      PT LONG TERM GOAL #5   Title  pt to be I with all HEP given as of last visit to maintain and progress current level of function     Time  6    Period  Weeks    Status  New    Target Date  03/06/18  Plan - 01/27/18 0844    Clinical Impression Statement  pt reports consistency with HEP and feels she is improving her ROM. Educated and performed TPDN today on the R bicep brachii followed wit IASTM and shoulder mobs. worked on strengthening to promote scapulohumeral rhythm which she demonstrated improved flexion. MHP end of session for Soreness from DN.     PT Next Visit Plan  update HEP, discuss DN, shoulder mobs, PROM/ AAROM, posture, modalities for pain PRN, how was DN     PT Home Exercise Plan  upper trap stretch, wand flexion/ abduction, and 3-way bicep stretching    Consulted and Agree with Plan of Care  Patient       Patient will benefit from skilled therapeutic intervention in order to improve the following deficits and impairments:     Visit Diagnosis: Chronic right shoulder pain  Stiffness of right shoulder, not elsewhere classified  Muscle weakness (generalized)     Problem List Patient Active Problem List   Diagnosis Date Noted  . Normocytic anemia 05/10/2013  . FH: colon cancer 05/10/2013   Starr Lake PT, DPT, LAT, ATC  01/27/18  8:47 AM      [facility] [address] [place], [place], [address] Phone: [phone]   Fax:  [phone]  Name: Gabrielle Chase MRN: [id number] Date of Birth: 08-31-1961

## 2018-01-30 ENCOUNTER — Encounter: Payer: Self-pay | Admitting: Physical Therapy

## 2018-01-30 ENCOUNTER — Ambulatory Visit: Payer: 59 | Admitting: Physical Therapy

## 2018-01-30 DIAGNOSIS — M6281 Muscle weakness (generalized): Secondary | ICD-10-CM

## 2018-01-30 DIAGNOSIS — M25511 Pain in right shoulder: Secondary | ICD-10-CM | POA: Diagnosis not present

## 2018-01-30 DIAGNOSIS — G8929 Other chronic pain: Secondary | ICD-10-CM | POA: Diagnosis not present

## 2018-01-30 DIAGNOSIS — M25611 Stiffness of right shoulder, not elsewhere classified: Secondary | ICD-10-CM

## 2018-01-30 NOTE — Therapy (Signed)
[place], [place], [address] Phone: [phone]   Fax:  [phone]  Physical Therapy Treatment  Patient Details  Name: Gabrielle Chase MRN: [id number] Date of Birth: Aug 26, 1961 Referring Provider: Sabino Niemann  MD   Encounter Date: 01/30/2018  PT End of Session - 01/30/18 0759    Visit Number  3    Number of Visits  13    Date for PT Re-Evaluation  03/06/18    PT Start Time  0801    PT Stop Time  0858    PT Time Calculation (min)  57 min    Activity Tolerance  Patient tolerated treatment well    Behavior During Therapy  [facility] for tasks assessed/performed       Past Medical History:  Diagnosis Date  . Polymyalgia rheumatica ([place])   . Seasonal allergies     Past Surgical History:  Procedure Laterality Date  . ABLATION ON ENDOMETRIOSIS  1993  . BREAST EXCISIONAL BIOPSY    . CESAREAN SECTION  1989  . COLONOSCOPY  03/30/2004   [id number] rectum.Somewhat noncompliant left colon/Otherwise, normal colonic mucosa  . COLONOSCOPY N/A 05/31/2013   Procedure: COLONOSCOPY;  Surgeon: Daneil Dolin, MD;  [facility];  Service: Endoscopy;  Laterality: N/A;  8:45  . RIGHT OOPHORECTOMY  1982   endometriosis    There were no vitals filed for this visit.  Subjective Assessment - 01/30/18 0754    Subjective  "I feel like I am able to reach better with some soreness, I think the DN really helped"    Currently in Pain?  No/denies    Pain Score  0-No pain                       [facility] Adult PT Treatment/Exercise - 01/30/18 0805      Shoulder Exercises: ROM/Strengthening   Other ROM/Strengthening Exercises  wall ladder flexion and abduction 5 x each with eccentric lowering with scapular upward assist with abduction      Shoulder Exercises: Stretch   Other Shoulder Stretches  rhomboid stretch 2 x 30 sec with hands clasped      Modalities   Modalities  Electrical Stimulation      Moist Heat Therapy   Number Minutes Moist Heat  15 Minutes    Moist Heat Location  Shoulder      Electrical Stimulation   Electrical Stimulation Location  R shoulder    Electrical Stimulation Action  IFC    Electrical Stimulation Parameters  L13 x 15 min, 100% scan    Electrical Stimulation Goals  Pain      Manual Therapy   Manual therapy comments  skilled palpation and monitoring throughout TPDN    Joint Mobilization  inferior/ PA and AP glides grade 3    Soft tissue mobilization  IASTM over the proximal biceps    Scapular Mobilization  upward scapular assist with active wall slides into flexion       Trigger Point Dry Needling - 01/30/18 0803    Consent Given?  Yes    Education Handout Provided  No    Muscles Treated Upper Body  Subscapularis biceps brachii    Subscapularis Response  Twitch response elicited;Palpable increased muscle length Latissimus on the R            PT Education - 01/30/18 0857    Education provided  Yes    Education Details  updated HEP for inferior  shoulder mobs.     Person(s) Educated  Patient    Methods  Explanation;Verbal cues;Handout    Comprehension  Verbalized understanding;Verbal cues required       PT Short Term Goals - 01/23/18 0943      PT SHORT TERM GOAL #1   Title  pt to be I with intial HEP    Time  3    Period  Weeks    Status  New    Target Date  03/06/18      PT SHORT TERM GOAL #2   Title  pt to verbalize/ demo proper posture and lifting mechanics/ techniques to prevent and reduce R shoulder pain    Time  3    Period  Weeks    Status  New    Target Date  02/13/18      PT SHORT TERM GOAL #3   Title  increase R grip strength by >/ 10# to demo imrpovement in shoulder function     Baseline  initally 44#    Time  3    Period  Weeks    Status  New    Target Date  03/06/18        PT Long Term Goals - 01/23/18 0945      PT LONG TERM GOAL #1   Title  increase R shoulder flexion/ abduction to >/= 120 degrees and  IR/ER to >/= 60 degrees (assessed in 90/90) for functional mobility with </= 2/10 pain    Time  6    Period  Weeks    Status  New    Target Date  03/06/18      PT LONG TERM GOAL #2   Title  improve R shoulder strength to >/= 4+/5 in all planes to promote stability and assist with lifting and carrying activities     Time  6    Period  Weeks    Status  New    Target Date  03/06/18      PT LONG TERM GOAL #3   Title  pt to be abel to lift/ lower from to and from an overhead shelf >/= 10#, push/pull >/= 20# for functional strength required for ADLs with </= 2/10  pain    Time  6    Period  Weeks    Status  New    Target Date  03/06/18      PT LONG TERM GOAL #4   Title  increase FOTO score to </= 29% limited to demo improvement in function    Time  6    Period  Weeks    Status  New    Target Date  03/20/18      PT LONG TERM GOAL #5   Title  pt to be I with all HEP given as of last visit to maintain and progress current level of function     Time  6    Period  Weeks    Status  New    Target Date  03/06/18            Plan - 01/30/18 0858    Clinical Impression Statement  pt reports no pain today and states she feels she is able to reach farther. continued TPDN for the bicep and added subscapularis, and soft tissue work and mobs to improve shoulder mobility. she continues to demo limited shoulder abduction but flexion is improving. utilzied E-stim with MHP to calm down soreness.    PT Next Visit  Plan  update HEP, discuss DN, shoulder mobs, PROM/ AAROM, posture, modalities for pain PRN, how was DN    PT Home Exercise Plan  upper trap stretch, wand flexion/ abduction, and 3-way bicep stretching, self inferior mobs.     Consulted and Agree with Plan of Care  Patient       Patient will benefit from skilled therapeutic intervention in order to improve the following deficits and impairments:     Visit Diagnosis: Chronic right shoulder pain  Stiffness of right shoulder, not  elsewhere classified  Muscle weakness (generalized)     Problem List Patient Active Problem List   Diagnosis Date Noted  . Normocytic anemia 05/10/2013  . FH: colon cancer 05/10/2013    Starr Lake PT, DPT, LAT, ATC  01/30/18  9:17 AM      [facility] [address] [place], [place], [address] Phone: [phone]   Fax:  [phone]  Name: Gabrielle Chase MRN: [id number] Date of Birth: April 17, 1961

## 2018-02-03 ENCOUNTER — Ambulatory Visit: Payer: 59 | Admitting: Physical Therapy

## 2018-02-03 ENCOUNTER — Encounter: Payer: Self-pay | Admitting: Physical Therapy

## 2018-02-03 DIAGNOSIS — G8929 Other chronic pain: Secondary | ICD-10-CM | POA: Diagnosis not present

## 2018-02-03 DIAGNOSIS — M25611 Stiffness of right shoulder, not elsewhere classified: Secondary | ICD-10-CM

## 2018-02-03 DIAGNOSIS — M6281 Muscle weakness (generalized): Secondary | ICD-10-CM | POA: Diagnosis not present

## 2018-02-03 DIAGNOSIS — M25511 Pain in right shoulder: Secondary | ICD-10-CM | POA: Diagnosis not present

## 2018-02-03 NOTE — Therapy (Signed)
[place], [place], [address] Phone: [phone]   Fax:  [phone]  Physical Therapy Treatment  Patient Details  Name: Gabrielle Chase MRN: [id number] Date of Birth: December 19, 1960 Referring Provider: Sabino Niemann  MD   Encounter Date: 02/03/2018  PT End of Session - 02/03/18 1013    Visit Number  4    Number of Visits  13    Date for PT Re-Evaluation  03/06/18    PT Start Time  3646    PT Stop Time  1105    PT Time Calculation (min)  51 min    Activity Tolerance  Patient tolerated treatment well    Behavior During Therapy  [facility] for tasks assessed/performed       Past Medical History:  Diagnosis Date  . Polymyalgia rheumatica ([place])   . Seasonal allergies     Past Surgical History:  Procedure Laterality Date  . ABLATION ON ENDOMETRIOSIS  1993  . BREAST EXCISIONAL BIOPSY    . CESAREAN SECTION  1989  . COLONOSCOPY  03/30/2004   [id number] rectum.Somewhat noncompliant left colon/Otherwise, normal colonic mucosa  . COLONOSCOPY N/A 05/31/2013   Procedure: COLONOSCOPY;  Surgeon: Daneil Dolin, MD;  [facility];  Service: Endoscopy;  Laterality: N/A;  8:45  . RIGHT OOPHORECTOMY  1982   endometriosis    There were no vitals filed for this visit.  Subjective Assessment - 02/03/18 1020    Subjective  "I feel like I am getting better and the shoulder motion is getting better, no pain today and I get some pain with stretching to reaching"     Currently in Pain?  No/denies    Pain Score  0-No pain    Aggravating Factors   reaching forward    Pain Relieving Factors  medication, icing, heat                       [facility] Adult PT Treatment/Exercise - 02/03/18 1029      Shoulder Exercises: Supine   Protraction  10 reps;Strengthening;Both with dowel rod      Shoulder Exercises: Seated   Other Seated Exercises  thoracic extension with breahting in/ out to faciliate extension/  flexion combined with shoulder flexion      Shoulder Exercises: ROM/Strengthening   Other ROM/Strengthening Exercises  seated ER with arm on the table bending forward 2 x 10      Shoulder Exercises: Power Warden/ranger Exercises  lat pull down 10# 1 x 10 letting weight pull into overhead stretch before pulling down again.       Moist Heat Therapy   Number Minutes Moist Heat  10 Minutes    Moist Heat Location  Shoulder in supine      Manual Therapy   Manual therapy comments  skilled palpation and monitoring throughout TPDN    Soft tissue mobilization  IASTM over the proximal biceps    Scapular Mobilization  Grade 4 in all planes        Trigger Point Dry Needling - 02/03/18 1028    Consent Given?  Yes    Education Handout Provided  No    Muscles Treated Upper Body  -- bicep brachii     Subscapularis Response  Twitch response elicited;Palpable increased muscle length Latissumus dorsi on the R           PT Education - 02/03/18 1100    Education provided  Yes    Education Details  updated HEP for reaching behind the back.     Person(s) Educated  Patient    Methods  Explanation;Verbal cues;Handout    Comprehension  Verbalized understanding;Verbal cues required       PT Short Term Goals - 01/23/18 0943      PT SHORT TERM GOAL #1   Title  pt to be I with intial HEP    Time  3    Period  Weeks    Status  New    Target Date  03/06/18      PT SHORT TERM GOAL #2   Title  pt to verbalize/ demo proper posture and lifting mechanics/ techniques to prevent and reduce R shoulder pain    Time  3    Period  Weeks    Status  New    Target Date  02/13/18      PT SHORT TERM GOAL #3   Title  increase R grip strength by >/ 10# to demo imrpovement in shoulder function     Baseline  initally 44#    Time  3    Period  Weeks    Status  New    Target Date  03/06/18        PT Long Term Goals - 01/23/18 0945      PT LONG TERM GOAL #1   Title  increase R shoulder  flexion/ abduction to >/= 120 degrees and IR/ER to >/= 60 degrees (assessed in 90/90) for functional mobility with </= 2/10 pain    Time  6    Period  Weeks    Status  New    Target Date  03/06/18      PT LONG TERM GOAL #2   Title  improve R shoulder strength to >/= 4+/5 in all planes to promote stability and assist with lifting and carrying activities     Time  6    Period  Weeks    Status  New    Target Date  03/06/18      PT LONG TERM GOAL #3   Title  pt to be abel to lift/ lower from to and from an overhead shelf >/= 10#, push/pull >/= 20# for functional strength required for ADLs with </= 2/10  pain    Time  6    Period  Weeks    Status  New    Target Date  03/06/18      PT LONG TERM GOAL #4   Title  increase FOTO score to </= 29% limited to demo improvement in function    Time  6    Period  Weeks    Status  New    Target Date  03/20/18      PT LONG TERM GOAL #5   Title  pt to be I with all HEP given as of last visit to maintain and progress current level of function     Time  6    Period  Weeks    Status  New    Target Date  03/06/18            Plan - 02/03/18 1100    Clinical Impression Statement  Gabrielle Chase continues to progress with physical therapy increasing flexion, but continues to have pain in the bicep with abduction. continued TPDN for the bicep brachii, latissimus dorsi and sub-scapularis followed with IASTM techniques. continued working on ROM for GHJ and scapula. continued MHP end of  session to calm down soreness.     PT Next Visit Plan  update HEP, discuss DN, shoulder mobs, AAROM, posture, modalities for pain PRN, how was DN    PT Home Exercise Plan  upper trap stretch, wand flexion/ abduction, and 3-way bicep stretching, self inferior mobs.     Consulted and Agree with Plan of Care  Patient       Patient will benefit from skilled therapeutic intervention in order to improve the following deficits and impairments:     Visit  Diagnosis: Chronic right shoulder pain  Stiffness of right shoulder, not elsewhere classified  Muscle weakness (generalized)     Problem List Patient Active Problem List   Diagnosis Date Noted  . Normocytic anemia 05/10/2013  . FH: colon cancer 05/10/2013   Starr Lake PT, DPT, LAT, ATC  02/03/18  11:05 AM      [facility] [address] [place], [place], [address] Phone: [phone]   Fax:  [phone]  Name: FABLE HUISMAN MRN: [id number] Date of Birth: 20-Aug-1961

## 2018-02-04 ENCOUNTER — Encounter: Payer: Self-pay | Admitting: Physical Therapy

## 2018-02-04 ENCOUNTER — Ambulatory Visit: Payer: 59 | Admitting: Physical Therapy

## 2018-02-04 DIAGNOSIS — G8929 Other chronic pain: Secondary | ICD-10-CM | POA: Diagnosis not present

## 2018-02-04 DIAGNOSIS — M25511 Pain in right shoulder: Principal | ICD-10-CM

## 2018-02-04 DIAGNOSIS — M25611 Stiffness of right shoulder, not elsewhere classified: Secondary | ICD-10-CM | POA: Diagnosis not present

## 2018-02-04 DIAGNOSIS — M6281 Muscle weakness (generalized): Secondary | ICD-10-CM | POA: Diagnosis not present

## 2018-02-04 NOTE — Therapy (Signed)
[place], [place], [address] Phone: [phone]   Fax:  [phone]  Physical Therapy Treatment  Patient Details  Name: Gabrielle Chase MRN: [id number] Date of Birth: Oct 08, 1961 Referring Provider: Sabino Niemann  MD   Encounter Date: 02/04/2018  PT End of Session - 02/04/18 1519    Visit Number  5    Number of Visits  13    Date for PT Re-Evaluation  03/06/18    PT Start Time  7253    PT Stop Time  1415    PT Time Calculation (min)  42 min    Activity Tolerance  Patient tolerated treatment well    Behavior During Therapy  [facility] for tasks assessed/performed       Past Medical History:  Diagnosis Date  . Polymyalgia rheumatica ([place])   . Seasonal allergies     Past Surgical History:  Procedure Laterality Date  . ABLATION ON ENDOMETRIOSIS  1993  . BREAST EXCISIONAL BIOPSY    . CESAREAN SECTION  1989  . COLONOSCOPY  03/30/2004   [id number] rectum.Somewhat noncompliant left colon/Otherwise, normal colonic mucosa  . COLONOSCOPY N/A 05/31/2013   Procedure: COLONOSCOPY;  Surgeon: Daneil Dolin, MD;  [facility];  Service: Endoscopy;  Laterality: N/A;  8:45  . RIGHT OOPHORECTOMY  1982   endometriosis    There were no vitals filed for this visit.  Subjective Assessment - 02/04/18 1338    Subjective  pt IS HELPING.  I CAN NOW  get coffee off 3rd shelf with a little pain.      I  can do more with less pain.   wakes 1-2 x a night  (Has been 6-7 x a night)    Currently in Pain?  No/denies up to 6/10  with behinf the back     Pain Score  0-No pain    Pain Location  Arm    Pain Orientation  Right;Posterior;Anterior biceps,  >triceps    Pain Descriptors / Indicators  Aching    Aggravating Factors   reaching    Pain Relieving Factors    ADvil for needle pain only   otherwise has not needed.  ,  ice,  heat    Effect of Pain on Daily Activities  wakes,  limits use    Multiple Pain Sites  No          [facility] PT Assessment - 02/04/18 0001      AROM   Right Shoulder Flexion  110 Degrees post session                   [facility] Adult PT Treatment/Exercise - 02/04/18 0001      Shoulder Exercises: Supine   Flexion  -- 8 X yellow narrow grip.    Other Supine Exercises  serratus punch 10 x 2 sets 0, 3 LBs painfree      Shoulder Exercises: Pulleys   Flexion  2 minutes    Scaption  2 minutes      Manual Therapy   Manual therapy comments  lateral glides myofascial work anterior shoulder increased mobility noted    Joint Mobilization  inferior lateral anterior gliges grade 2,3.  for rom with mivement ie fists on forehead moving elbows apart with inferior glided  some without movement    Passive ROM  self mobs  with towel roll at axilla and posterior horizontal add and distraction holding pillowcase.   ROM increased.,   myo  fascial release.         Trigger Point Dry Needling - 02/03/18 1028    Consent Given?  Yes    Education Handout Provided  No    Muscles Treated Upper Body  -- bicep brachii     Subscapularis Response  Twitch response elicited;Palpable increased muscle length Latissumus dorsi on the R           PT Education - 02/04/18 1518    Education provided  Yes    Education Details  shoulder posture,  how it works,  Modified HEP for reaching around back    [employer]) Educated  Patient    Methods  Explanation;Demonstration;Tactile cues;Verbal cues    Comprehension  Verbalized understanding       PT Short Term Goals - 01/23/18 0943      PT SHORT TERM GOAL #1   Title  pt to be I with intial HEP    Time  3    Period  Weeks    Status  New    Target Date  03/06/18      PT SHORT TERM GOAL #2   Title  pt to verbalize/ demo proper posture and lifting mechanics/ techniques to prevent and reduce R shoulder pain    Time  3    Period  Weeks    Status  New    Target Date  02/13/18      PT SHORT TERM GOAL #3   Title  increase R grip strength by >/ 10# to  demo imrpovement in shoulder function     Baseline  initally 44#    Time  3    Period  Weeks    Status  New    Target Date  03/06/18        PT Long Term Goals - 01/23/18 0945      PT LONG TERM GOAL #1   Title  increase R shoulder flexion/ abduction to >/= 120 degrees and IR/ER to >/= 60 degrees (assessed in 90/90) for functional mobility with </= 2/10 pain    Time  6    Period  Weeks    Status  New    Target Date  03/06/18      PT LONG TERM GOAL #2   Title  improve R shoulder strength to >/= 4+/5 in all planes to promote stability and assist with lifting and carrying activities     Time  6    Period  Weeks    Status  New    Target Date  03/06/18      PT LONG TERM GOAL #3   Title  pt to be abel to lift/ lower from to and from an overhead shelf >/= 10#, push/pull >/= 20# for functional strength required for ADLs with </= 2/10  pain    Time  6    Period  Weeks    Status  New    Target Date  03/06/18      PT LONG TERM GOAL #4   Title  increase FOTO score to </= 29% limited to demo improvement in function    Time  6    Period  Weeks    Status  New    Target Date  03/20/18      PT LONG TERM GOAL #5   Title  pt to be I with all HEP given as of last visit to maintain and progress current level of function     Time  6  Period  Weeks    Status  New    Target Date  03/06/18            Plan - 02/04/18 1519    Clinical Impression Statement  Patient had no pain at end of session.  There was some mild discomfort anterior shoulder.  She declined the need for modalities.  manual was helpful  ROM110 flexion post session.  DN was helpful last session however it left bruise.  (Kinesiotex tape for bruise applied)    PT Next Visit Plan  update HEP, discuss DN, shoulder mobs, AAROM, posture, modalities for pain PRN,  Add doorway stretch to HEP    PT Home Exercise Plan  upper trap stretch, wand flexion/ abduction, and 3-way bicep stretching, self inferior mobs.  with added rolled  towel.    Consulted and Agree with Plan of Care  Patient       Patient will benefit from skilled therapeutic intervention in order to improve the following deficits and impairments:     Visit Diagnosis: Chronic right shoulder pain  Stiffness of right shoulder, not elsewhere classified  Muscle weakness (generalized)     Problem List Patient Active Problem List   Diagnosis Date Noted  . Normocytic anemia 05/10/2013  . FH: colon cancer 05/10/2013    Gabrielle Chase PTA 02/04/2018, 3:37 PM  [facility] [address] [place], [place], [address] Phone: [phone]   Fax:  [phone]  Name: Gabrielle Chase MRN: [id number] Date of Birth: [birth year]

## 2018-02-10 ENCOUNTER — Ambulatory Visit: Payer: 59 | Admitting: Physical Therapy

## 2018-02-10 ENCOUNTER — Encounter: Payer: Self-pay | Admitting: Physical Therapy

## 2018-02-10 DIAGNOSIS — M25611 Stiffness of right shoulder, not elsewhere classified: Secondary | ICD-10-CM | POA: Diagnosis not present

## 2018-02-10 DIAGNOSIS — M25511 Pain in right shoulder: Principal | ICD-10-CM

## 2018-02-10 DIAGNOSIS — M6281 Muscle weakness (generalized): Secondary | ICD-10-CM | POA: Diagnosis not present

## 2018-02-10 DIAGNOSIS — G8929 Other chronic pain: Secondary | ICD-10-CM

## 2018-02-10 NOTE — Therapy (Signed)
[place], [place], [address] Phone: [phone]   Fax:  [phone]  Physical Therapy Treatment  Patient Details  Name: Gabrielle Chase MRN: [id number] Date of Birth: Aug 31, 1961 Referring Provider: Sabino Niemann  MD   Encounter Date: 02/10/2018  PT End of Session - 02/10/18 0903    Visit Number  6    Number of Visits  13    Date for PT Re-Evaluation  03/06/18    PT Start Time  0850    PT Stop Time  0941    PT Time Calculation (min)  51 min    Activity Tolerance  Patient tolerated treatment well    Behavior During Therapy  [facility] for tasks assessed/performed       Past Medical History:  Diagnosis Date  . Polymyalgia rheumatica ([place])   . Seasonal allergies     Past Surgical History:  Procedure Laterality Date  . ABLATION ON ENDOMETRIOSIS  1993  . BREAST EXCISIONAL BIOPSY    . CESAREAN SECTION  1989  . COLONOSCOPY  03/30/2004   [id number] rectum.Somewhat noncompliant left colon/Otherwise, normal colonic mucosa  . COLONOSCOPY N/A 05/31/2013   Procedure: COLONOSCOPY;  Surgeon: Daneil Dolin, MD;  [facility];  Service: Endoscopy;  Laterality: N/A;  8:45  . RIGHT OOPHORECTOMY  1982   endometriosis    There were no vitals filed for this visit.  Subjective Assessment - 02/10/18 0855    Subjective  "I can tell I am getting better, I am reaching higher and I don't have any pain today"     Currently in Pain?  No/denies    Pain Score  0-No pain    Pain Orientation  Right    Pain Type  Chronic pain                       [facility] Adult PT Treatment/Exercise - 02/10/18 0858      Self-Care   Self-Care  Other Self-Care Comments    Other Self-Care Comments   anatomy of the shoulder and effects on improper motion and causes of impingement.       Shoulder Exercises: ROM/Strengthening   UBE (Upper Arm Bike)  L3 x 6 min changing direction at 3 min      Shoulder Exercises:  Stretch   Other Shoulder Stretches  rhomboid stretch 2 x 30 sec with hands clasped      Moist Heat Therapy   Number Minutes Moist Heat  10 Minutes    Moist Heat Location  Shoulder in L sidelying      Manual Therapy   Manual therapy comments  skilled palpation and monitoring pt during TPDN    Soft tissue mobilization  IASTM over the proximal biceps and middle deltoid    Scapular Mobilization  Grade 4 with upward rotation 2 x 10    Passive ROM  abduction with oscillaitons working into end ranges.        Trigger Point Dry Needling - 02/10/18 0858    Consent Given?  Yes    Education Handout Provided  No    Muscles Treated Upper Body  -- middle deltoid             PT Short Term Goals - 01/23/18 0943      PT SHORT TERM GOAL #1   Title  pt to be I with intial HEP    Time  3    Period  Weeks  Status  New    Target Date  03/06/18      PT SHORT TERM GOAL #2   Title  pt to verbalize/ demo proper posture and lifting mechanics/ techniques to prevent and reduce R shoulder pain    Time  3    Period  Weeks    Status  New    Target Date  02/13/18      PT SHORT TERM GOAL #3   Title  increase R grip strength by >/ 10# to demo imrpovement in shoulder function     Baseline  initally 44#    Time  3    Period  Weeks    Status  New    Target Date  03/06/18        PT Long Term Goals - 01/23/18 0945      PT LONG TERM GOAL #1   Title  increase R shoulder flexion/ abduction to >/= 120 degrees and IR/ER to >/= 60 degrees (assessed in 90/90) for functional mobility with </= 2/10 pain    Time  6    Period  Weeks    Status  New    Target Date  03/06/18      PT LONG TERM GOAL #2   Title  improve R shoulder strength to >/= 4+/5 in all planes to promote stability and assist with lifting and carrying activities     Time  6    Period  Weeks    Status  New    Target Date  03/06/18      PT LONG TERM GOAL #3   Title  pt to be abel to lift/ lower from to and from an overhead shelf  >/= 10#, push/pull >/= 20# for functional strength required for ADLs with </= 2/10  pain    Time  6    Period  Weeks    Status  New    Target Date  03/06/18      PT LONG TERM GOAL #4   Title  increase FOTO score to </= 29% limited to demo improvement in function    Time  6    Period  Weeks    Status  New    Target Date  03/20/18      PT LONG TERM GOAL #5   Title  pt to be I with all HEP given as of last visit to maintain and progress current level of function     Time  6    Period  Weeks    Status  New    Target Date  03/06/18            Plan - 02/10/18 0946    Clinical Impression Statement  pt continues to report no pain and only has pain with abduction and reaching backward. cotninued TPDN focusing on middle deltoid followed with soft tissue work. continued shoulder /scapular mobs to improve scapulohumeral rhythm focusing on abduction. continued MHP end of session to calm down soreness end of session.     PT Treatment/Interventions  ADLs/Self Care Home Management;Cryotherapy;Electrical Stimulation;Iontophoresis 4mg /ml Dexamethasone;Moist Heat;Ultrasound;Therapeutic activities;Therapeutic exercise;Dry needling;Taping;Manual techniques;Passive range of motion;Neuromuscular re-education    PT Next Visit Plan  update HEP PRN, shoulder mobs, AAROM, posture, modalities for pain PRN,  Add doorway stretch to HEP, DN PRN may try rhomboids.     PT Home Exercise Plan  upper trap stretch, wand flexion/ abduction, and 3-way bicep stretching, self inferior mobs.  with added rolled towel.    Consulted  and Agree with Plan of Care  Patient       Patient will benefit from skilled therapeutic intervention in order to improve the following deficits and impairments:  Pain, Increased fascial restricitons, Decreased strength, Decreased range of motion, Decreased activity tolerance, Decreased endurance, Postural dysfunction, Improper body mechanics, Impaired UE functional use  Visit  Diagnosis: Chronic right shoulder pain  Stiffness of right shoulder, not elsewhere classified  Muscle weakness (generalized)     Problem List Patient Active Problem List   Diagnosis Date Noted  . Normocytic anemia 05/10/2013  . FH: colon cancer 05/10/2013   Starr Lake PT, DPT, LAT, ATC  02/10/18  9:48 AM      [facility] [address] [place], [place], [address] Phone: [phone]   Fax:  [phone]  Name: Gabrielle Chase MRN: [id number] Date of Birth: 1961-07-01

## 2018-02-13 ENCOUNTER — Encounter: Payer: Self-pay | Admitting: Physical Therapy

## 2018-02-13 ENCOUNTER — Ambulatory Visit: Payer: 59 | Admitting: Physical Therapy

## 2018-02-13 DIAGNOSIS — M25511 Pain in right shoulder: Secondary | ICD-10-CM | POA: Diagnosis not present

## 2018-02-13 DIAGNOSIS — M25611 Stiffness of right shoulder, not elsewhere classified: Secondary | ICD-10-CM

## 2018-02-13 DIAGNOSIS — G8929 Other chronic pain: Secondary | ICD-10-CM

## 2018-02-13 DIAGNOSIS — M6281 Muscle weakness (generalized): Secondary | ICD-10-CM | POA: Diagnosis not present

## 2018-02-13 NOTE — Therapy (Signed)
[place], [place], [address] Phone: [phone]   Fax:  [phone]  Physical Therapy Treatment  Patient Details  Name: Gabrielle Chase MRN: [id number] Date of Birth: May 31, 1961 Referring Provider: Sabino Niemann  MD   Encounter Date: 02/13/2018  PT End of Session - 02/13/18 0845    Visit Number  7    Number of Visits  13    Date for PT Re-Evaluation  03/06/18    PT Start Time  0845    PT Stop Time  0938    PT Time Calculation (min)  53 min    Activity Tolerance  Patient tolerated treatment well    Behavior During Therapy  [facility] for tasks assessed/performed       Past Medical History:  Diagnosis Date  . Polymyalgia rheumatica ([place])   . Seasonal allergies     Past Surgical History:  Procedure Laterality Date  . ABLATION ON ENDOMETRIOSIS  1993  . BREAST EXCISIONAL BIOPSY    . CESAREAN SECTION  1989  . COLONOSCOPY  03/30/2004   EPP:IRJJOA rectum.Somewhat noncompliant left colon/Otherwise, normal colonic mucosa  . COLONOSCOPY N/A 05/31/2013   Procedure: COLONOSCOPY;  Surgeon: Daneil Dolin, MD;  [facility];  Service: Endoscopy;  Laterality: N/A;  8:45  . RIGHT OOPHORECTOMY  1982   endometriosis    There were no vitals filed for this visit.  Subjective Assessment - 02/13/18 0846    Subjective  "I am more sore today and it is the worse with the DN was the worst I felt last time. I also got hit my an elevator door the other day"     Currently in Pain?  Yes    Pain Score  1     Pain Location  Arm    Pain Orientation  Right    Pain Descriptors / Indicators  Aching    Pain Type  Chronic pain    Aggravating Factors   reaching                       [facility] Adult PT Treatment/Exercise - 02/13/18 0857      Self-Care   Other Self-Care Comments   how to perform MTPR using theracane and where to find the tool an benefits      Shoulder Exercises: Supine   Protraction   Strengthening;Both with pertubations 2 x 15    Other Supine Exercises  scapular retraction with ER 2 x 10 with red theraband      Shoulder Exercises: Seated   Other Seated Exercises  lower trap strengthening iwth elbows on table 2 x 10 with red theraband      Shoulder Exercises: ROM/Strengthening   UBE (Upper Arm Bike)  L3 x 6 min changing direction at 3 min      Shoulder Exercises: Stretch   Other Shoulder Stretches  rhomboid stretch 2 x 30 sec with hands clasped      Moist Heat Therapy   Number Minutes Moist Heat  10 Minutes    Moist Heat Location  Shoulder in supine      Electrical Stimulation   Electrical Stimulation Location  R shoulder    Electrical Stimulation Action  IFc    Electrical Stimulation Parameters  L10 x 15 min at 100% limited    Electrical Stimulation Goals  Pain      Manual Therapy   Manual Therapy  Taping    Soft tissue mobilization  IASTM over the proximal biceps and middle deltoid    Kinesiotex  Facilitate Muscle;Inhibit Muscle      Kinesiotix   Inhibit Muscle   Bicep brachii    Facilitate Muscle   and promote shoult mobility               PT Short Term Goals - 01/23/18 0943      PT SHORT TERM GOAL #1   Title  pt to be I with intial HEP    Time  3    Period  Weeks    Status  New    Target Date  03/06/18      PT SHORT TERM GOAL #2   Title  pt to verbalize/ demo proper posture and lifting mechanics/ techniques to prevent and reduce R shoulder pain    Time  3    Period  Weeks    Status  New    Target Date  02/13/18      PT SHORT TERM GOAL #3   Title  increase R grip strength by >/ 10# to demo imrpovement in shoulder function     Baseline  initally 44#    Time  3    Period  Weeks    Status  New    Target Date  03/06/18        PT Long Term Goals - 01/23/18 0945      PT LONG TERM GOAL #1   Title  increase R shoulder flexion/ abduction to >/= 120 degrees and IR/ER to >/= 60 degrees (assessed in 90/90) for functional mobility with  </= 2/10 pain    Time  6    Period  Weeks    Status  New    Target Date  03/06/18      PT LONG TERM GOAL #2   Title  improve R shoulder strength to >/= 4+/5 in all planes to promote stability and assist with lifting and carrying activities     Time  6    Period  Weeks    Status  New    Target Date  03/06/18      PT LONG TERM GOAL #3   Title  pt to be abel to lift/ lower from to and from an overhead shelf >/= 10#, push/pull >/= 20# for functional strength required for ADLs with </= 2/10  pain    Time  6    Period  Weeks    Status  New    Target Date  03/06/18      PT LONG TERM GOAL #4   Title  increase FOTO score to </= 29% limited to demo improvement in function    Time  6    Period  Weeks    Status  New    Target Date  03/20/18      PT LONG TERM GOAL #5   Title  pt to be I with all HEP given as of last visit to maintain and progress current level of function     Time  6    Period  Weeks    Status  New    Target Date  03/06/18            Plan - 02/13/18 0956    Clinical Impression Statement  pt reports increased soreness in the shoulder today located in the middle deltoid. opted to hold off on TPDN and teach MTPR release techniques which she rpeorted relief of pain. trialed KT taping to inhibit  the bicep brachii and promote shoulder movement. continued working on shoulder strengthening for scapulohumeral rhythm. utilized MHP and E-stim end of session.     PT Treatment/Interventions  ADLs/Self Care Home Management;Cryotherapy;Electrical Stimulation;Iontophoresis 4mg /ml Dexamethasone;Moist Heat;Ultrasound;Therapeutic activities;Therapeutic exercise;Dry needling;Taping;Manual techniques;Passive range of motion;Neuromuscular re-education    PT Next Visit Plan  update HEP PRN, shoulder mobs, AAROM, posture, modalities for pain PRN,  Add doorway stretch to HEP, how was KT taping.     PT Home Exercise Plan  upper trap stretch, wand flexion/ abduction, and 3-way bicep stretching,  self inferior mobs.  with added rolled towel.    Consulted and Agree with Plan of Care  Patient       Patient will benefit from skilled therapeutic intervention in order to improve the following deficits and impairments:  Pain, Increased fascial restricitons, Decreased strength, Decreased range of motion, Decreased activity tolerance, Decreased endurance, Postural dysfunction, Improper body mechanics, Impaired UE functional use  Visit Diagnosis: Chronic right shoulder pain  Stiffness of right shoulder, not elsewhere classified  Muscle weakness (generalized)     Problem List Patient Active Problem List   Diagnosis Date Noted  . Normocytic anemia 05/10/2013  . FH: colon cancer 05/10/2013   Starr Lake PT, DPT, LAT, ATC  02/13/18  10:08 AM      [facility] [address] [place], [place], [address] Phone: [phone]   Fax:  [phone]  Name: Gabrielle Chase MRN: [id number] Date of Birth: 06-20-1961

## 2018-02-16 ENCOUNTER — Ambulatory Visit: Payer: 59 | Admitting: Physical Therapy

## 2018-02-16 ENCOUNTER — Encounter: Payer: Self-pay | Admitting: Physical Therapy

## 2018-02-16 DIAGNOSIS — M25611 Stiffness of right shoulder, not elsewhere classified: Secondary | ICD-10-CM | POA: Diagnosis not present

## 2018-02-16 DIAGNOSIS — G8929 Other chronic pain: Secondary | ICD-10-CM

## 2018-02-16 DIAGNOSIS — M6281 Muscle weakness (generalized): Secondary | ICD-10-CM

## 2018-02-16 DIAGNOSIS — M25511 Pain in right shoulder: Principal | ICD-10-CM

## 2018-02-16 NOTE — Therapy (Signed)
[place], [place], [address] Phone: [phone]   Fax:  [phone]  Physical Therapy Treatment  Patient Details  Name: Gabrielle Chase MRN: [id number] Date of Birth: Jan 14, 1961 Referring Provider: Sabino Niemann  MD   Encounter Date: 02/16/2018  PT End of Session - 02/16/18 1100    Visit Number  8    Number of Visits  13    Date for PT Re-Evaluation  03/06/18    PT Start Time  1101    PT Stop Time  1151    PT Time Calculation (min)  50 min    Activity Tolerance  Patient tolerated treatment well    Behavior During Therapy  [facility] for tasks assessed/performed       Past Medical History:  Diagnosis Date  . Polymyalgia rheumatica ([place])   . Seasonal allergies     Past Surgical History:  Procedure Laterality Date  . ABLATION ON ENDOMETRIOSIS  1993  . BREAST EXCISIONAL BIOPSY    . CESAREAN SECTION  1989  . COLONOSCOPY  03/30/2004   [id number] rectum.Somewhat noncompliant left colon/Otherwise, normal colonic mucosa  . COLONOSCOPY N/A 05/31/2013   Procedure: COLONOSCOPY;  Surgeon: Daneil Dolin, MD;  [facility];  Service: Endoscopy;  Laterality: N/A;  8:45  . RIGHT OOPHORECTOMY  1982   endometriosis    There were no vitals filed for this visit.  Subjective Assessment - 02/16/18 1104    Subjective  "I am doing pretty good today"     Currently in Pain?  No/denies         [facility] PT Assessment - 02/16/18 1108      Observation/Other Assessments   Focus on Therapeutic Outcomes (FOTO)   37% limited      AROM   Right Shoulder Extension  50 Degrees    Right Shoulder Flexion  108 Degrees    Right Shoulder ABduction  62 Degrees    Right Shoulder Internal Rotation  54 Degrees with shoulder in neutral    Right Shoulder External Rotation  62 Degrees with shoulder in nuetral                   [facility] Adult PT Treatment/Exercise - 02/16/18 1111      Shoulder Exercises:  ROM/Strengthening   UBE (Upper Arm Bike)  L3 x 6 min changing direct at 3 min      Shoulder Exercises: Stretch   Other Shoulder Stretches  upper trap stretch 2 x 30 sec    Other Shoulder Stretches  rhomboid stretch 2 x 30 sec with hands clasped      Moist Heat Therapy   Number Minutes Moist Heat  10 Minutes    Moist Heat Location  Shoulder      Electrical Stimulation   Electrical Stimulation Location  R shoulder    Electrical Stimulation Action  IFC    Electrical Stimulation Parameters  L15 x 10 min @ 100% scan    Electrical Stimulation Goals  Pain      Manual Therapy   Manual therapy comments  MTRP along upper trap/ levator scapulae, and infraspinatus    Joint Mobilization  inferior lateral anterior gliges grade 2,3.  for rom with mivement ie fists on forehead moving elbows apart with inferior glided  some without movement    Passive ROM  abduction with oscillaitons working into end ranges.              PT Education -  02/16/18 1207    Education provided  Yes    Education Details  udpated HEP for self shoulder mob to promote abduction    Person(s) Educated  Patient    Methods  Explanation;Verbal cues;Handout    Comprehension  Verbalized understanding;Verbal cues required       PT Short Term Goals - 01/23/18 0943      PT SHORT TERM GOAL #1   Title  pt to be I with intial HEP    Time  3    Period  Weeks    Status  New    Target Date  03/06/18      PT SHORT TERM GOAL #2   Title  pt to verbalize/ demo proper posture and lifting mechanics/ techniques to prevent and reduce R shoulder pain    Time  3    Period  Weeks    Status  New    Target Date  02/13/18      PT SHORT TERM GOAL #3   Title  increase R grip strength by >/ 10# to demo imrpovement in shoulder function     Baseline  initally 44#    Time  3    Period  Weeks    Status  New    Target Date  03/06/18        PT Long Term Goals - 01/23/18 0945      PT LONG TERM GOAL #1   Title  increase R shoulder  flexion/ abduction to >/= 120 degrees and IR/ER to >/= 60 degrees (assessed in 90/90) for functional mobility with </= 2/10 pain    Time  6    Period  Weeks    Status  New    Target Date  03/06/18      PT LONG TERM GOAL #2   Title  improve R shoulder strength to >/= 4+/5 in all planes to promote stability and assist with lifting and carrying activities     Time  6    Period  Weeks    Status  New    Target Date  03/06/18      PT LONG TERM GOAL #3   Title  pt to be abel to lift/ lower from to and from an overhead shelf >/= 10#, push/pull >/= 20# for functional strength required for ADLs with </= 2/10  pain    Time  6    Period  Weeks    Status  New    Target Date  03/06/18      PT LONG TERM GOAL #4   Title  increase FOTO score to </= 29% limited to demo improvement in function    Time  6    Period  Weeks    Status  New    Target Date  03/20/18      PT LONG TERM GOAL #5   Title  pt to be I with all HEP given as of last visit to maintain and progress current level of function     Time  6    Period  Weeks    Status  New    Target Date  03/06/18            Plan - 02/16/18 1209    Clinical Impression Statement  no pain reported today and she stated the taping felt pretty good and still had it on. focused todays session on promoted shoulder abd/ IR which with mobs and stretching. utilized e-stim with MHp end of session.  she is improving with shoulder movement, see flow sheet.     PT Next Visit Plan  update HEP PRN, shoulder mobs, AAROM, posture, modalities for pain PRN,  Add doorway stretch to HEP, re-apply KT tape    PT Home Exercise Plan  upper trap stretch, wand flexion/ abduction, and 3-way bicep stretching, self inferior mobs.  with added rolled towel.    Consulted and Agree with Plan of Care  Patient       Patient will benefit from skilled therapeutic intervention in order to improve the following deficits and impairments:     Visit Diagnosis: Chronic right shoulder  pain  Stiffness of right shoulder, not elsewhere classified  Muscle weakness (generalized)     Problem List Patient Active Problem List   Diagnosis Date Noted  . Normocytic anemia 05/10/2013  . FH: colon cancer 05/10/2013   Starr Lake PT, DPT, LAT, ATC  02/16/18  12:17 PM      [facility] [address] [place], [place], [address] Phone: [phone]   Fax:  [phone]  Name: AGAM TUOHY MRN: [id number] Date of Birth: Dec 09, 1960

## 2018-02-18 ENCOUNTER — Encounter: Payer: Self-pay | Admitting: Physical Therapy

## 2018-02-18 ENCOUNTER — Ambulatory Visit: Payer: 59 | Attending: Rheumatology | Admitting: Physical Therapy

## 2018-02-18 DIAGNOSIS — G8929 Other chronic pain: Secondary | ICD-10-CM | POA: Diagnosis present

## 2018-02-18 DIAGNOSIS — M25611 Stiffness of right shoulder, not elsewhere classified: Secondary | ICD-10-CM

## 2018-02-18 DIAGNOSIS — M6281 Muscle weakness (generalized): Secondary | ICD-10-CM

## 2018-02-18 DIAGNOSIS — M25511 Pain in right shoulder: Secondary | ICD-10-CM | POA: Insufficient documentation

## 2018-02-18 NOTE — Therapy (Signed)
[place], [place], [address] Phone: [phone]   Fax:  [phone]  Physical Therapy Treatment  Patient Details  Name: Gabrielle Chase MRN: [id number] Date of Birth: 04-19-1961 Referring Provider: Sabino Niemann  MD   Encounter Date: 02/18/2018  PT End of Session - 02/18/18 1021    Visit Number  9    Number of Visits  13    Date for PT Re-Evaluation  03/06/18    PT Start Time  1018    PT Stop Time  1101    PT Time Calculation (min)  43 min    Activity Tolerance  Patient tolerated treatment well    Behavior During Therapy  [facility] for tasks assessed/performed       Past Medical History:  Diagnosis Date  . Polymyalgia rheumatica ([place])   . Seasonal allergies     Past Surgical History:  Procedure Laterality Date  . ABLATION ON ENDOMETRIOSIS  1993  . BREAST EXCISIONAL BIOPSY    . CESAREAN SECTION  1989  . COLONOSCOPY  03/30/2004   VQM:[id number] rectum.Somewhat noncompliant left colon/Otherwise, normal colonic mucosa  . COLONOSCOPY N/A 05/31/2013   Procedure: COLONOSCOPY;  Surgeon: Daneil Dolin, MD;  [facility];  Service: Endoscopy;  Laterality: N/A;  8:45  . RIGHT OOPHORECTOMY  1982   endometriosis    There were no vitals filed for this visit.  Subjective Assessment - 02/18/18 1020    Subjective  "I am doing pretty good, some burning on the inside of my R shoulder blade"    Currently in Pain?  No/denies    Aggravating Factors   raching    Pain Relieving Factors  medication,                        [facility] Adult PT Treatment/Exercise - 02/18/18 1021      Shoulder Exercises: Standing   External Rotation  Right;Strengthening;10 reps;Theraband    Theraband Level (Shoulder External Rotation)  Level 3 (Green)    Internal Rotation  Strengthening;10 reps;Theraband    Theraband Level (Shoulder Internal Rotation)  Level 3 (Green)      Shoulder Exercises: ROM/Strengthening   UBE (Upper Arm Bike)  L3 x 6 min  changing direction at 3 min      Shoulder Exercises: Stretch   Other Shoulder Stretches  upper trap stretch 2 x 30 sec    Other Shoulder Stretches  rhomboid stretch 2 x 30 sec with hands clasped      Electrical Stimulation   Electrical Stimulation Location  R shoulder    Electrical Stimulation Goals  Pain      Manual Therapy   Manual therapy comments  skilled palpation and monitoring pt throughout TPDN    Soft tissue mobilization  IASTM along Rhomboid/ infraspinatus     Passive ROM  abduction with oscillaitons working into end ranges.       Kinesiotix   Inhibit Muscle   Bicep brachii    Facilitate Muscle   and promote shoult mobility       Trigger Point Dry Needling - 02/18/18 1022    Consent Given?  Yes    Education Handout Provided  No    Muscles Treated Upper Body  Rhomboids;Infraspinatus    Rhomboids Response  Twitch response elicited;Palpable increased muscle length    Infraspinatus Response  Twitch response elicited;Palpable increased muscle length           PT Education -  02/18/18 1102    Education provided  Yes    Education Details  reviewed and upated HEP    Person(s) Educated  Patient    Methods  Explanation;Verbal cues;Handout    Comprehension  Verbalized understanding;Verbal cues required       PT Short Term Goals - 01/23/18 0943      PT SHORT TERM GOAL #1   Title  pt to be I with intial HEP    Time  3    Period  Weeks    Status  New    Target Date  03/06/18      PT SHORT TERM GOAL #2   Title  pt to verbalize/ demo proper posture and lifting mechanics/ techniques to prevent and reduce R shoulder pain    Time  3    Period  Weeks    Status  New    Target Date  02/13/18      PT SHORT TERM GOAL #3   Title  increase R grip strength by >/ 10# to demo imrpovement in shoulder function     Baseline  initally 44#    Time  3    Period  Weeks    Status  New    Target Date  03/06/18        PT Long Term Goals -  01/23/18 0945      PT LONG TERM GOAL #1   Title  increase R shoulder flexion/ abduction to >/= 120 degrees and IR/ER to >/= 60 degrees (assessed in 90/90) for functional mobility with </= 2/10 pain    Time  6    Period  Weeks    Status  New    Target Date  03/06/18      PT LONG TERM GOAL #2   Title  improve R shoulder strength to >/= 4+/5 in all planes to promote stability and assist with lifting and carrying activities     Time  6    Period  Weeks    Status  New    Target Date  03/06/18      PT LONG TERM GOAL #3   Title  pt to be abel to lift/ lower from to and from an overhead shelf >/= 10#, push/pull >/= 20# for functional strength required for ADLs with </= 2/10  pain    Time  6    Period  Weeks    Status  New    Target Date  03/06/18      PT LONG TERM GOAL #4   Title  increase FOTO score to </= 29% limited to demo improvement in function    Time  6    Period  Weeks    Status  New    Target Date  03/20/18      PT LONG TERM GOAL #5   Title  pt to be I with all HEP given as of last visit to maintain and progress current level of function     Time  6    Period  Weeks    Status  New    Target Date  03/06/18            Plan - 02/18/18 1103    Clinical Impression Statement  pt reports increased abduction ROM today and decreased pain since the last session. Continued TPDN focusing on R rhomboids and infraspinatus. continued KT taping which pt reported relief of pain and tightness. continued stretching and strengthening which she tolerated well.  PT Next Visit Plan  update HEP PRN, shoulder mobs, AAROM, posture, modalities for pain PRN,  Add doorway stretch to HEP,KT tape    PT Home Exercise Plan  upper trap stretch, wand flexion/ abduction, and 3-way bicep stretching, self inferior mobs.  with added rolled towel. Shoulder IR/ER    Consulted and Agree with Plan of Care  Patient       Patient will benefit from skilled therapeutic intervention in order to improve the  following deficits and impairments:  Pain, Increased fascial restricitons, Decreased strength, Decreased range of motion, Decreased activity tolerance, Decreased endurance, Postural dysfunction, Improper body mechanics, Impaired UE functional use  Visit Diagnosis: Chronic right shoulder pain  Stiffness of right shoulder, not elsewhere classified  Muscle weakness (generalized)     Problem List Patient Active Problem List   Diagnosis Date Noted  . Normocytic anemia 05/10/2013  . FH: colon cancer 05/10/2013   Starr Lake PT, DPT, LAT, ATC  02/18/18  11:06 AM      [place] [facility] [address] [place], [place], [address] Phone: [phone]   Fax:  [phone]  Name: Gabrielle Chase MRN: [id number] Date of Birth: January 23, 1961

## 2018-03-02 ENCOUNTER — Ambulatory Visit: Payer: 59 | Admitting: Physical Therapy

## 2018-03-02 ENCOUNTER — Encounter: Payer: Self-pay | Admitting: Physical Therapy

## 2018-03-02 DIAGNOSIS — M25611 Stiffness of right shoulder, not elsewhere classified: Secondary | ICD-10-CM

## 2018-03-02 DIAGNOSIS — M25511 Pain in right shoulder: Secondary | ICD-10-CM | POA: Diagnosis not present

## 2018-03-02 DIAGNOSIS — G8929 Other chronic pain: Secondary | ICD-10-CM | POA: Diagnosis not present

## 2018-03-02 DIAGNOSIS — M6281 Muscle weakness (generalized): Secondary | ICD-10-CM

## 2018-03-02 NOTE — Therapy (Signed)
[place], [place], [address] Phone: [phone]   Fax:  [phone]  Physical Therapy Treatment / Re-certification  Patient Details  Name: Gabrielle Chase MRN: [id number] Date of Birth: 02/01/1961 Referring Provider: Sabino Niemann  MD   Encounter Date: 03/02/2018  PT End of Session - 03/02/18 0916    Visit Number  10    Number of Visits  18    Date for PT Re-Evaluation  04/13/18    PT Start Time  0850    PT Stop Time  0939    PT Time Calculation (min)  49 min    Activity Tolerance  Patient tolerated treatment well    Behavior During Therapy  [facility] for tasks assessed/performed       Past Medical History:  Diagnosis Date  . Polymyalgia rheumatica ([place])   . Seasonal allergies     Past Surgical History:  Procedure Laterality Date  . ABLATION ON ENDOMETRIOSIS  1993  . BREAST EXCISIONAL BIOPSY    . CESAREAN SECTION  1989  . COLONOSCOPY  03/30/2004   JIR:CVELFY rectum.Somewhat noncompliant left colon/Otherwise, normal colonic mucosa  . COLONOSCOPY N/A 05/31/2013   Procedure: COLONOSCOPY;  Surgeon: Daneil Dolin, MD;  [facility];  Service: Endoscopy;  Laterality: N/A;  8:45  . RIGHT OOPHORECTOMY  1982   endometriosis    There were no vitals filed for this visit.  Subjective Assessment - 03/02/18 0852    Subjective  "the shoulder is doing pretty good, alittle sore but I have been trying to do my exercises. I have been continuing to trying reaching back"    Currently in Pain?  Yes    Pain Score  2     Pain Location  Arm    Pain Orientation  Right    Pain Descriptors / Indicators  Aching    Pain Type  Chronic pain    Pain Onset  More than a month ago    Pain Frequency  Intermittent    Aggravating Factors   reaching    Pain Relieving Factors  medication    Effect of Pain on Daily Activities  wakes, limits used         [facility] PT Assessment - 03/02/18 0903      AROM   Right  Shoulder Extension  48 Degrees    Right Shoulder Flexion  110 Degrees    Right Shoulder ABduction  100 Degrees    Right Shoulder Internal Rotation  53 Degrees assessed in neutral    Right Shoulder External Rotation  74 Degrees assessed 90/90      Strength   Right Shoulder Flexion  3+/5 in available ROM    Right Shoulder Extension  4/5 in available ROM    Right Shoulder ABduction  3/5 pain during testing    Right Shoulder Internal Rotation  4/5    Right Shoulder External Rotation  4-/5 pain during testing    Right Hand Grip (lbs)  55.3 55,55,56                   [facility] Adult PT Treatment/Exercise - 03/02/18 0858      Shoulder Exercises: Standing   Other Standing Exercises  lower trap Y's 2 x 10    Other Standing Exercises  wall push up with a plus 2 x 10       Shoulder Exercises: Pulleys   Scaption  3 minutes      Shoulder Exercises:  ROM/Strengthening   UBE (Upper Arm Bike)  L3 x 6 min  changing direction at 3 min    Other ROM/Strengthening Exercises  wall ladder flexion and abduction with eccentric lowering x 5 ea. combined with scapular upward assist       Shoulder Exercises: Stretch   Other Shoulder Stretches  rhomboid stretch 2 x 30 sec with hands clasped      Moist Heat Therapy   Number Minutes Moist Heat  10 Minutes    Moist Heat Location  Shoulder      Manual Therapy   Manual therapy comments  MTPR along R Rhomboids x 2             PT Education - 03/02/18 0934    Education provided  Yes    Education Details  reviewed previously provided HEP, and updated for wall push ups"     Person(s) Educated  Patient    Methods  Explanation;Verbal cues;Handout    Comprehension  Verbalized understanding;Verbal cues required       PT Short Term Goals - 03/02/18 0911      PT SHORT TERM GOAL #1   Title  pt to be I with intial HEP    Time  3    Period  Weeks    Status  Achieved      PT SHORT TERM GOAL #2   Title  pt to verbalize/ demo proper posture and  lifting mechanics/ techniques to prevent and reduce R shoulder pain    Time  3    Period  Weeks    Status  Achieved      PT SHORT TERM GOAL #3   Title  increase R grip strength by >/ 10# to demo imrpovement in shoulder function     Time  3    Period  Weeks    Status  Achieved        PT Long Term Goals - 03/02/18 0912      PT LONG TERM GOAL #1   Title  increase R shoulder flexion/ abduction to >/= 120 degrees and IR/ER to >/= 60 degrees (assessed in 90/90) for functional mobility with </= 2/10 pain    Time  6    Period  Weeks    Status  On-going      PT LONG TERM GOAL #2   Title  improve R shoulder strength to >/= 4+/5 in all planes to promote stability and assist with lifting and carrying activities     Time  6    Period  Weeks    Status  On-going      PT LONG TERM GOAL #3   Title  pt to be abel to lift/ lower from to and from an overhead shelf >/= 10#, push/pull >/= 20# for functional strength required for ADLs with </= 2/10  pain    Time  6    Period  Weeks    Status  On-going      PT LONG TERM GOAL #4   Title  increase FOTO score to </= 29% limited to demo improvement in function    Time  6    Period  Weeks    Status  On-going      PT LONG TERM GOAL #5   Title  pt to be I with all HEP given as of last visit to maintain and progress current level of function     Time  6    Period  Weeks  Status  On-going            Plan - 03/02/18 1015    Clinical Impression Statement  [name] continues to progress with physical therapy increasing shoulder mobility and strength. she met all STG today and is progressing with long term goals appropriately. continued working on ROM and scapulohumeral rhythm exercises to promote proper form. she would benefit from continued physical therapy to increase shoulder mobility/ and strength, and return to PLOF.     PT Frequency  2x / week    PT Duration  4 weeks    PT Treatment/Interventions  ADLs/Self Care Home  Management;Cryotherapy;Electrical Stimulation;Iontophoresis 17m/ml Dexamethasone;Moist Heat;Ultrasound;Therapeutic activities;Therapeutic exercise;Dry needling;Taping;Manual techniques;Passive range of motion;Neuromuscular re-education    PT Next Visit Plan  update HEP PRN, shoulder mobs, AAROM, posture, modalities for pain PRN,  Add doorway stretch to HEP,KT tape    PT Home Exercise Plan  upper trap stretch, wand flexion/ abduction, and 3-way bicep stretching, self inferior mobs.  with added rolled towel. Shoulder IR/ER    Consulted and Agree with Plan of Care  Patient       Patient will benefit from skilled therapeutic intervention in order to improve the following deficits and impairments:  Pain, Increased fascial restricitons, Decreased strength, Decreased range of motion, Decreased activity tolerance, Decreased endurance, Postural dysfunction, Improper body mechanics, Impaired UE functional use  Visit Diagnosis: Chronic right shoulder pain  Stiffness of right shoulder, not elsewhere classified  Muscle weakness (generalized)     Problem List Patient Active Problem List   Diagnosis Date Noted  . Normocytic anemia 05/10/2013  . FH: colon cancer 05/10/2013   KStarr LakePT, DPT, LAT, ATC  03/02/18  10:18 AM      [facility] [address]Phone: [phone]  Fax:  [phone] Name: Gabrielle LANIUSMRN: [id number] of Birth: 5April 24, 1962

## 2018-03-04 ENCOUNTER — Encounter: Payer: Self-pay | Admitting: Physical Therapy

## 2018-03-04 ENCOUNTER — Ambulatory Visit: Payer: 59 | Admitting: Physical Therapy

## 2018-03-04 DIAGNOSIS — M6281 Muscle weakness (generalized): Secondary | ICD-10-CM | POA: Diagnosis not present

## 2018-03-04 DIAGNOSIS — M25511 Pain in right shoulder: Secondary | ICD-10-CM | POA: Diagnosis not present

## 2018-03-04 DIAGNOSIS — M25611 Stiffness of right shoulder, not elsewhere classified: Secondary | ICD-10-CM

## 2018-03-04 DIAGNOSIS — G8929 Other chronic pain: Secondary | ICD-10-CM

## 2018-03-04 NOTE — Patient Instructions (Signed)
Issued from ex drawer:  Shoulder ER stretch 1-2 x a day 3-5 X each 30 seconds,  Single arm  And  Supine hook lying thoracic stretch with rolled towel and shoulder retraction 1-2 x a day 10 x each 5 seconds

## 2018-03-04 NOTE — Therapy (Signed)
[place], [place], [address] Phone: [phone]   Fax:  [phone]  Physical Therapy Treatment  Patient Details  Name: Gabrielle Chase MRN: [id number] Date of Birth: 1961/02/17 Referring Provider: Sabino Niemann  MD   Encounter Date: 03/04/2018  PT End of Session - 03/04/18 1629    Visit Number  11    Number of Visits  18    Date for PT Re-Evaluation  04/13/18    PT Start Time  1504    PT Stop Time  1558    PT Time Calculation (min)  54 min    Activity Tolerance  Patient tolerated treatment well    Behavior During Therapy  [facility] for tasks assessed/performed       Past Medical History:  Diagnosis Date  . Polymyalgia rheumatica ([place])   . Seasonal allergies     Past Surgical History:  Procedure Laterality Date  . ABLATION ON ENDOMETRIOSIS  1993  . BREAST EXCISIONAL BIOPSY    . CESAREAN SECTION  1989  . COLONOSCOPY  03/30/2004   HEN:IDPOEU rectum.Somewhat noncompliant left colon/Otherwise, normal colonic mucosa  . COLONOSCOPY N/A 05/31/2013   Procedure: COLONOSCOPY;  Surgeon: Daneil Dolin, MD;  [facility];  Service: Endoscopy;  Laterality: N/A;  8:45  . RIGHT OOPHORECTOMY  1982   endometriosis    There were no vitals filed for this visit.  Subjective Assessment - 03/04/18 1506    Subjective  No pain in quite awhile ,  I get discomfort reaching to the back of the shelf with a full container of coffee.    Exercises are going well.  Less hard to put belt through jeans but is still a hard one.     Currently in Pain?  No/denies    Pain Orientation  Right    Pain Descriptors / Indicators  Aching    Pain Type  Chronic pain    Pain Frequency  Intermittent    Aggravating Factors   reaching to back of shelf,  heavier items,  reaching behing still difficult    Pain Relieving Factors  rest,  heat    Effect of Pain on Daily Activities  less wakes at night with turning over,  lifting things  with straight arm,  extra time to put belt in loops behind her,                        [facility] Adult PT Treatment/Exercise - 03/04/18 0001      Shoulder Exercises: ROM/Strengthening   UBE (Upper Arm Bike)  L3 x 6 min  changing direction at 3 min      Cryotherapy   Number Minutes Cryotherapy  10 Minutes    Cryotherapy Location  Shoulder    Type of Cryotherapy  -- cold pack      Manual Therapy   Manual therapy comments  peri scapular mobilization,  soft tissue work teres area posterior shoulder sensitive,  neuromuscular trigger point release.     Joint Mobilization  grade1 posterior glides    Scapular Mobilization  yes    Passive ROM  rotations with distraction              PT Education - 03/04/18 1629    Education provided  Yes    Education Details  HEP    Person(s) Educated  Patient    Methods  Explanation;Demonstration;Tactile cues;Verbal cues;Handout    Comprehension  Verbalized understanding;Returned demonstration  PT Short Term Goals - 03/02/18 0911      PT SHORT TERM GOAL #1   Title  pt to be I with intial HEP    Time  3    Period  Weeks    Status  Achieved      PT SHORT TERM GOAL #2   Title  pt to verbalize/ demo proper posture and lifting mechanics/ techniques to prevent and reduce R shoulder pain    Time  3    Period  Weeks    Status  Achieved      PT SHORT TERM GOAL #3   Title  increase R grip strength by >/ 10# to demo imrpovement in shoulder function     Time  3    Period  Weeks    Status  Achieved        PT Long Term Goals - 03/02/18 0912      PT LONG TERM GOAL #1   Title  increase R shoulder flexion/ abduction to >/= 120 degrees and IR/ER to >/= 60 degrees (assessed in 90/90) for functional mobility with </= 2/10 pain    Time  6    Period  Weeks    Status  On-going      PT LONG TERM GOAL #2   Title  improve R shoulder strength to >/= 4+/5 in all planes to promote stability and assist with lifting and carrying  activities     Time  6    Period  Weeks    Status  On-going      PT LONG TERM GOAL #3   Title  pt to be abel to lift/ lower from to and from an overhead shelf >/= 10#, push/pull >/= 20# for functional strength required for ADLs with </= 2/10  pain    Time  6    Period  Weeks    Status  On-going      PT LONG TERM GOAL #4   Title  increase FOTO score to </= 29% limited to demo improvement in function    Time  6    Period  Weeks    Status  On-going      PT LONG TERM GOAL #5   Title  pt to be I with all HEP given as of last visit to maintain and progress current level of function     Time  6    Period  Weeks    Status  On-going            Plan - 03/04/18 1752    Clinical Impression Statement  Patient able to reach mid gluteal at end of session.  She tolerated manual and exercise with mild increases in pain with no pain at end of session.  New Stretching added to home focused on anterior shoulder/ chest stretching.     PT Next Visit Plan  update HEP PRN, shoulder mobs, AAROM, posture, modalities for pain PRN,  Check doorway stretch  and thoracic extension stretches,KT tape    PT Home Exercise Plan  upper trap stretch, wand flexion/ abduction, and 3-way bicep stretching, self inferior mobs.  with added rolled towel. Shoulder IR/ER,  doorway stretch,  Thoracic extension over rolled towel stretch with retraction.    Consulted and Agree with Plan of Care  Patient       Patient will benefit from skilled therapeutic intervention in order to improve the following deficits and impairments:     Visit Diagnosis: Chronic right shoulder  pain  Stiffness of right shoulder, not elsewhere classified  Muscle weakness (generalized)     Problem List Patient Active Problem List   Diagnosis Date Noted  . Normocytic anemia 05/10/2013  . FH: colon cancer 05/10/2013    HARRIS,KAREN PTA 03/04/2018, 5:59 PM  [facility] [address] [place], [place], [address] Phone: [phone]   Fax:  [phone]  Name: Gabrielle Chase MRN: [id number] Date of Birth: 1961-05-09

## 2018-03-09 ENCOUNTER — Ambulatory Visit: Payer: 59 | Admitting: Physical Therapy

## 2018-03-09 ENCOUNTER — Encounter: Payer: Self-pay | Admitting: Physical Therapy

## 2018-03-09 DIAGNOSIS — M25511 Pain in right shoulder: Principal | ICD-10-CM

## 2018-03-09 DIAGNOSIS — M6281 Muscle weakness (generalized): Secondary | ICD-10-CM | POA: Diagnosis not present

## 2018-03-09 DIAGNOSIS — M25611 Stiffness of right shoulder, not elsewhere classified: Secondary | ICD-10-CM

## 2018-03-09 DIAGNOSIS — G8929 Other chronic pain: Secondary | ICD-10-CM | POA: Diagnosis not present

## 2018-03-09 NOTE — Therapy (Signed)
[place], [place], [address] Phone: [phone]   Fax:  [phone]  Physical Therapy Treatment  Patient Details  Name: Gabrielle Chase MRN: [id number] Date of Birth: 04-Jul-1961 Referring Provider: Sabino Niemann  MD   Encounter Date: 03/09/2018  PT End of Session - 03/09/18 0924    Visit Number  12    Number of Visits  18    Date for PT Re-Evaluation  04/13/18    PT Start Time  0848    PT Stop Time  0931    PT Time Calculation (min)  43 min    Activity Tolerance  Patient tolerated treatment well       Past Medical History:  Diagnosis Date  . Polymyalgia rheumatica ([place])   . Seasonal allergies     Past Surgical History:  Procedure Laterality Date  . ABLATION ON ENDOMETRIOSIS  1993  . BREAST EXCISIONAL BIOPSY    . CESAREAN SECTION  1989  . COLONOSCOPY  03/30/2004   YDX:AJOINO rectum.Somewhat noncompliant left colon/Otherwise, normal colonic mucosa  . COLONOSCOPY N/A 05/31/2013   Procedure: COLONOSCOPY;  Surgeon: Daneil Dolin, MD;  [facility];  Service: Endoscopy;  Laterality: N/A;  8:45  . RIGHT OOPHORECTOMY  1982   endometriosis    There were no vitals filed for this visit.  Subjective Assessment - 03/09/18 0848    Subjective  "I am doing pretty good, no pain except for if I pushing'     Currently in Pain?  No/denies                       [facility] Adult PT Treatment/Exercise - 03/09/18 0907      Shoulder Exercises: Supine   Other Supine Exercises  scapular setting 2 x 15 with red theraband sustained horizontal abduction  with shoulder flexion      Shoulder Exercises: Seated   Other Seated Exercises  scaption 2 x 10 1#      Shoulder Exercises: Sidelying   ABduction  Strengthening;Right;15 reps x 2      Shoulder Exercises: ROM/Strengthening   UBE (Upper Arm Bike)  L3 x 4 min  forward only      Manual Therapy   Manual therapy comments  Skilled palpation  and monitoring of pt throughout TPDN    Scapular Mobilization  with pt in L sidelying reaching arm overhead in abducted position     Passive ROM  ER with arm on elevated table       Trigger Point Dry Needling - 03/09/18 0851    Consent Given?  Yes    Education Handout Provided  No    Rhomboids Response  Twitch response elicited;Palpable increased muscle length    Infraspinatus Response  Twitch response elicited;Palpable increased muscle length    Subscapularis Response  Twitch response elicited;Palpable increased muscle length             PT Short Term Goals - 03/02/18 0911      PT SHORT TERM GOAL #1   Title  pt to be I with intial HEP    Time  3    Period  Weeks    Status  Achieved      PT SHORT TERM GOAL #2   Title  pt to verbalize/ demo proper posture and lifting mechanics/ techniques to prevent and reduce R shoulder pain    Time  3    Period  Weeks  Status  Achieved      PT SHORT TERM GOAL #3   Title  increase R grip strength by >/ 10# to demo imrpovement in shoulder function     Time  3    Period  Weeks    Status  Achieved        PT Long Term Goals - 03/02/18 0912      PT LONG TERM GOAL #1   Title  increase R shoulder flexion/ abduction to >/= 120 degrees and IR/ER to >/= 60 degrees (assessed in 90/90) for functional mobility with </= 2/10 pain    Time  6    Period  Weeks    Status  On-going      PT LONG TERM GOAL #2   Title  improve R shoulder strength to >/= 4+/5 in all planes to promote stability and assist with lifting and carrying activities     Time  6    Period  Weeks    Status  On-going      PT LONG TERM GOAL #3   Title  pt to be abel to lift/ lower from to and from an overhead shelf >/= 10#, push/pull >/= 20# for functional strength required for ADLs with </= 2/10  pain    Time  6    Period  Weeks    Status  On-going      PT LONG TERM GOAL #4   Title  increase FOTO score to </= 29% limited to demo improvement in function    Time  6     Period  Weeks    Status  On-going      PT LONG TERM GOAL #5   Title  pt to be I with all HEP given as of last visit to maintain and progress current level of function     Time  6    Period  Weeks    Status  On-going            Plan - 03/09/18 1096    Clinical Impression Statement  pt continues to make progress with PT improving shoulder mobility. Continued TPDN focusing on Rhomboids, infraspinatus and medial sub-scapularis followed with IASTM techniques and scapular mobs with active abduction. end of session she reports decreased pain and declined modalities.     PT Treatment/Interventions  ADLs/Self Care Home Management;Cryotherapy;Electrical Stimulation;Iontophoresis 4mg /ml Dexamethasone;Moist Heat;Ultrasound;Therapeutic activities;Therapeutic exercise;Dry needling;Taping;Manual techniques;Passive range of motion;Neuromuscular re-education    PT Next Visit Plan  update HEP PRN, shoulder mobs, AAROM, posture, modalities for pain PRN,  Check doorway stretch  and thoracic extension stretches,KT tape    PT Home Exercise Plan  upper trap stretch, wand flexion/ abduction, and 3-way bicep stretching, self inferior mobs.  with added rolled towel. Shoulder IR/ER,  doorway stretch,  Thoracic extension over rolled towel stretch with retraction.    Consulted and Agree with Plan of Care  Patient       Patient will benefit from skilled therapeutic intervention in order to improve the following deficits and impairments:  Pain, Increased fascial restricitons, Decreased strength, Decreased range of motion, Decreased activity tolerance, Decreased endurance, Postural dysfunction, Improper body mechanics, Impaired UE functional use  Visit Diagnosis: Chronic right shoulder pain  Stiffness of right shoulder, not elsewhere classified  Muscle weakness (generalized)     Problem List Patient Active Problem List   Diagnosis Date Noted  . Normocytic anemia 05/10/2013  . FH: colon cancer 05/10/2013    Meleane Selinger PT, DPT, LAT, ATC  03/09/18  9:50 AM      [place], [place], [address] Phone: [phone]   Fax:  [phone]  Name: Gabrielle Chase MRN: [id number] Date of Birth: 04/09/1961

## 2018-03-11 ENCOUNTER — Encounter: Payer: Self-pay | Admitting: Physical Therapy

## 2018-03-11 ENCOUNTER — Ambulatory Visit: Payer: 59 | Admitting: Physical Therapy

## 2018-03-11 DIAGNOSIS — M25511 Pain in right shoulder: Principal | ICD-10-CM

## 2018-03-11 DIAGNOSIS — M6281 Muscle weakness (generalized): Secondary | ICD-10-CM

## 2018-03-11 DIAGNOSIS — M25611 Stiffness of right shoulder, not elsewhere classified: Secondary | ICD-10-CM | POA: Diagnosis not present

## 2018-03-11 DIAGNOSIS — G8929 Other chronic pain: Secondary | ICD-10-CM | POA: Diagnosis not present

## 2018-03-11 NOTE — Therapy (Signed)
[place], [place], [address] Phone: [phone]   Fax:  [phone]  Physical Therapy Treatment  Patient Details  Name: PAHOUA SCHREINER MRN: [id number] Date of Birth: 03/16/1961 Referring Provider: Sabino Niemann  MD   Encounter Date: 03/11/2018  PT End of Session - 03/11/18 0925    Visit Number  13    Number of Visits  18    Date for PT Re-Evaluation  04/13/18    PT Start Time  0845    PT Stop Time  0928    PT Time Calculation (min)  43 min    Activity Tolerance  Patient tolerated treatment well    Behavior During Therapy  [facility] for tasks assessed/performed       Past Medical History:  Diagnosis Date  . Polymyalgia rheumatica ([place])   . Seasonal allergies     Past Surgical History:  Procedure Laterality Date  . ABLATION ON ENDOMETRIOSIS  1993  . BREAST EXCISIONAL BIOPSY    . CESAREAN SECTION  1989  . COLONOSCOPY  03/30/2004   EXH:[id number] rectum.Somewhat noncompliant left colon/Otherwise, normal colonic mucosa  . COLONOSCOPY N/A 05/31/2013   Procedure: COLONOSCOPY;  Surgeon: Daneil Dolin, MD;  [facility];  Service: Endoscopy;  Laterality: N/A;  8:45  . RIGHT OOPHORECTOMY  1982   endometriosis    There were no vitals filed for this visit.  Subjective Assessment - 03/11/18 0846    Subjective  " The shoulder continues to improve with mobility"     Currently in Pain?  No/denies    Pain Score  0-No pain    Aggravating Factors   reaching back     Pain Relieving Factors  resting, heat                       [facility] Adult PT Treatment/Exercise - 03/11/18 0850      Shoulder Exercises: Supine   Other Supine Exercises  foam roll series: ceiling punches, horizontal abd/add, alternating ceiling punches, back stroke, x to Y,  1 x 15 ea. ,laying on horizontal bolster with thoracic extension with bil UE flexion 2 x 10 with exhalation    Other Supine Exercises  scapular setting  2 x 15 with pushing in on pilates circle and pullout out on pilates circle.       Shoulder Exercises: ROM/Strengthening   UBE (Upper Arm Bike)  L2 x 8 changing direction at 4 min      Shoulder Exercises: Stretch   Other Shoulder Stretches  door way pec stretch 2 x 30 sec,     Other Shoulder Stretches  3 way bicep stretching holding ea. 30 sec      Manual Therapy   Soft tissue mobilization  distal clavicle mobs inferior/ posterior grade 3             PT Education - 03/11/18 0932    Education provided  Yes    Education Details  updated HEP     Person(s) Educated  Patient    Methods  Explanation;Verbal cues    Comprehension  Verbalized understanding;Verbal cues required       PT Short Term Goals - 03/02/18 0911      PT SHORT TERM GOAL #1   Title  pt to be I with intial HEP    Time  3    Period  Weeks    Status  Achieved      PT SHORT  TERM GOAL #2   Title  pt to verbalize/ demo proper posture and lifting mechanics/ techniques to prevent and reduce R shoulder pain    Time  3    Period  Weeks    Status  Achieved      PT SHORT TERM GOAL #3   Title  increase R grip strength by >/ 10# to demo imrpovement in shoulder function     Time  3    Period  Weeks    Status  Achieved        PT Long Term Goals - 03/02/18 0912      PT LONG TERM GOAL #1   Title  increase R shoulder flexion/ abduction to >/= 120 degrees and IR/ER to >/= 60 degrees (assessed in 90/90) for functional mobility with </= 2/10 pain    Time  6    Period  Weeks    Status  On-going      PT LONG TERM GOAL #2   Title  improve R shoulder strength to >/= 4+/5 in all planes to promote stability and assist with lifting and carrying activities     Time  6    Period  Weeks    Status  On-going      PT LONG TERM GOAL #3   Title  pt to be abel to lift/ lower from to and from an overhead shelf >/= 10#, push/pull >/= 20# for functional strength required for ADLs with </= 2/10  pain    Time  6    Period  Weeks     Status  On-going      PT LONG TERM GOAL #4   Title  increase FOTO score to </= 29% limited to demo improvement in function    Time  6    Period  Weeks    Status  On-going      PT LONG TERM GOAL #5   Title  pt to be I with all HEP given as of last visit to maintain and progress current level of function     Time  6    Period  Weeks    Status  On-going            Plan - 03/11/18 3532    Clinical Impression Statement  cointinued focus on scapulohumeral rhythm and clavicle mobility. progressed thoracic mobility with foam roll which she perofrmed well reporting soreness that dissipated with continued reps. she continues to report improvement with mobility and declined modalities at end of session.     PT Next Visit Plan  update HEP PRN, shoulder mobs, AAROM, posture, modalities for pain PRN,  Check doorway stretch  and thoracic extension stretches,KT tape PRN    PT Home Exercise Plan  upper trap stretch, wand flexion/ abduction, and 3-way bicep stretching, self inferior mobs.  with added rolled towel. Shoulder IR/ER,  doorway stretch,  Thoracic extension over rolled towel stretch with retraction.    Consulted and Agree with Plan of Care  Patient       Patient will benefit from skilled therapeutic intervention in order to improve the following deficits and impairments:  Pain, Increased fascial restricitons, Decreased strength, Decreased range of motion, Decreased activity tolerance, Decreased endurance, Postural dysfunction, Improper body mechanics, Impaired UE functional use  Visit Diagnosis: Chronic right shoulder pain  Stiffness of right shoulder, not elsewhere classified  Muscle weakness (generalized)     Problem List Patient Active Problem List   Diagnosis Date Noted  . Normocytic  anemia 05/10/2013  . FH: colon cancer 05/10/2013   Starr Lake PT, DPT, LAT, ATC  03/11/18  9:34 AM      [facility] [address] [place], [place], [address] Phone: [phone]   Fax:  [phone]  Name: TAWNY RASPBERRY MRN: [id number] Date of Birth: 12-03-1960

## 2018-03-17 ENCOUNTER — Encounter: Payer: Self-pay | Admitting: Physical Therapy

## 2018-03-17 ENCOUNTER — Ambulatory Visit: Payer: 59 | Admitting: Physical Therapy

## 2018-03-17 DIAGNOSIS — M25511 Pain in right shoulder: Secondary | ICD-10-CM | POA: Diagnosis not present

## 2018-03-17 DIAGNOSIS — M6281 Muscle weakness (generalized): Secondary | ICD-10-CM

## 2018-03-17 DIAGNOSIS — M25611 Stiffness of right shoulder, not elsewhere classified: Secondary | ICD-10-CM

## 2018-03-17 DIAGNOSIS — G8929 Other chronic pain: Secondary | ICD-10-CM

## 2018-03-17 NOTE — Therapy (Signed)
[place], [place], [address] Phone: [phone]   Fax:  [phone]  Physical Therapy Treatment  Patient Details  Name: Gabrielle Chase MRN: [id number] Date of Birth: [birth year] Referring Provider: Sabino Niemann  MD   Encounter Date: 03/17/2018  PT End of Session - 03/17/18 1321    Visit Number  14    Number of Visits  18    Date for PT Re-Evaluation  04/13/18    PT Start Time  0848    PT Stop Time  0930    PT Time Calculation (min)  42 min    Activity Tolerance  Patient tolerated treatment well    Behavior During Therapy  [facility] for tasks assessed/performed       Past Medical History:  Diagnosis Date  . Polymyalgia rheumatica ([place])   . Seasonal allergies     Past Surgical History:  Procedure Laterality Date  . ABLATION ON ENDOMETRIOSIS  1993  . BREAST EXCISIONAL BIOPSY    . CESAREAN SECTION  1989  . COLONOSCOPY  03/30/2004   CNO:[id number] rectum.Somewhat noncompliant left colon/Otherwise, normal colonic mucosa  . COLONOSCOPY N/A 05/31/2013   Procedure: COLONOSCOPY;  Surgeon: Daneil Dolin, MD;  [facility];  Service: Endoscopy;  Laterality: N/A;  8:45  . RIGHT OOPHORECTOMY  1982   endometriosis    There were no vitals filed for this visit.  Subjective Assessment - 03/17/18 0853    Subjective  Doing the exercises n the floor.  No pain like I was having.  Not waking with turning over.    Currently in Pain?  No/denies    Pain Location  Shoulder    Pain Orientation  Right    Pain Descriptors / Indicators  -- stretching sensation with exercises and ADL    Aggravating Factors   cranking lawnmower   discomfort  brief sotting unsupported.    Pain Relieving Factors  resting   heat,  change of activity    Effect of Pain on Daily Activities    extra time to put belt loops behing                       [facility] Adult PT Treatment/Exercise - 03/17/18 0001      Self-Care   Other Self-Care Comments   reading posture       Shoulder Exercises: Supine   Other Supine Exercises  foam roll series: ceiling punches, horizontal abd/add,(with stretch  toward each wall at end range) alternating ceiling punches, back stroke, x to Y,  1 x 15 ea. ,laying on horizontal bolster with thoracic extension with bil UE flexion 2 x 10 with exhalation    Other Supine Exercises  scapular setting 2 x 15 with pushing in on pilates circle and pullout out on pilates circle.  stretches in arm ROM end reps        Manual Therapy   Manual therapy comments  myofascial work anterior shouldr,  softtissue work peri scapular,  teres tender,  mobs gr 2 distraction lateral and inferior capsule  Tissue softened             PT Education - 03/17/18 0914    Education provided  Yes    Education Details  posture    Person(s) Educated  Patient    Methods  Explanation;Handout    Comprehension  Verbalized understanding       PT Short Term Goals - 03/02/18 [phone]  PT SHORT TERM GOAL #1   Title  pt to be I with intial HEP    Time  3    Period  Weeks    Status  Achieved      PT SHORT TERM GOAL #2   Title  pt to verbalize/ demo proper posture and lifting mechanics/ techniques to prevent and reduce R shoulder pain    Time  3    Period  Weeks    Status  Achieved      PT SHORT TERM GOAL #3   Title  increase R grip strength by >/ 10# to demo imrpovement in shoulder function     Time  3    Period  Weeks    Status  Achieved        PT Long Term Goals - 03/17/18 1324      PT LONG TERM GOAL #1   Title  increase R shoulder flexion/ abduction to >/= 120 degrees and IR/ER to >/= 60 degrees (assessed in 90/90) for functional mobility with </= 2/10 pain    Time  6    Period  Weeks    Status  Unable to assess      PT LONG TERM GOAL #2   Title  improve R shoulder strength to >/= 4+/5 in all planes to promote stability and assist with lifting and carrying activities     Time  6    Period   Weeks    Status  Unable to assess      PT LONG TERM GOAL #3   Title  pt to be abel to lift/ lower from to and from an overhead shelf >/= 10#, push/pull >/= 20# for functional strength required for ADLs with </= 2/10  pain    Time  6    Period  Weeks    Status  Unable to assess      PT LONG TERM GOAL #4   Title  increase FOTO score to </= 29% limited to demo improvement in function    Time  6    Period  Weeks    Status  Unable to assess      PT LONG TERM GOAL #5   Baseline  independent with exercises issued so far    Time  6    Period  Weeks    Status  On-going            Plan - 03/17/18 1322    Clinical Impression Statement  Patient ROM increases with increased reps  into flexion.  She now can touch waist band posterior with heavy stretching feeling.  She declined the need for moda;ities for pain.  No pain at end of session. m    PT Next Visit Plan  update HEP PRN, shoulder mobs, AAROM, posture,   Check doorway stretch  and thoracic extension stretches,  Consider working toward LTG"s activities.  Lifting etc.    PT Home Exercise Plan  upper trap stretch, wand flexion/ abduction, and 3-way bicep stretching, self inferior mobs.  with added rolled towel. Shoulder IR/ER,  doorway stretch,  Thoracic extension over rolled towel stretch with retraction.    Consulted and Agree with Plan of Care  Patient       Patient will benefit from skilled therapeutic intervention in order to improve the following deficits and impairments:     Visit Diagnosis: Chronic right shoulder pain  Stiffness of right shoulder, not elsewhere classified  Muscle weakness (generalized)  Problem List Patient Active Problem List   Diagnosis Date Noted  . Normocytic anemia 05/10/2013  . FH: colon cancer 05/10/2013    Whitney Bingaman  PTA 03/17/2018, 1:28 PM  [facility] [address] [place], [place], [address] Phone: [phone]   Fax:   [phone]  Name: DEMARIA Chase MRN: [id number] Date of Birth: Nov 22, 1960

## 2018-03-17 NOTE — Patient Instructions (Signed)
Reading    When reading, hold material in tilted position and maintain good sitting posture.   Copyright  VHI. All rights reserved.   

## 2018-03-19 ENCOUNTER — Ambulatory Visit: Payer: 59 | Admitting: Physical Therapy

## 2018-03-19 ENCOUNTER — Encounter: Payer: Self-pay | Admitting: Physical Therapy

## 2018-03-19 DIAGNOSIS — M6281 Muscle weakness (generalized): Secondary | ICD-10-CM

## 2018-03-19 DIAGNOSIS — M25611 Stiffness of right shoulder, not elsewhere classified: Secondary | ICD-10-CM

## 2018-03-19 DIAGNOSIS — G8929 Other chronic pain: Secondary | ICD-10-CM | POA: Diagnosis not present

## 2018-03-19 DIAGNOSIS — M25511 Pain in right shoulder: Principal | ICD-10-CM

## 2018-03-19 NOTE — Therapy (Signed)
[place], [place], [address] Phone: [phone]   Fax:  [phone]  Physical Therapy Treatment  Patient Details  Name: Gabrielle Chase MRN: [id number] Date of Birth: September 16, 1961 Referring Provider: Sabino Niemann  MD   Encounter Date: 03/19/2018  PT End of Session - 03/19/18 0841    Visit Number  15    Number of Visits  18    Date for PT Re-Evaluation  04/13/18    PT Start Time  0843    PT Stop Time  0935    PT Time Calculation (min)  52 min    Activity Tolerance  Patient tolerated treatment well    Behavior During Therapy  [facility] for tasks assessed/performed       Past Medical History:  Diagnosis Date  . Polymyalgia rheumatica ([place])   . Seasonal allergies     Past Surgical History:  Procedure Laterality Date  . ABLATION ON ENDOMETRIOSIS  1993  . BREAST EXCISIONAL BIOPSY    . CESAREAN SECTION  1989  . COLONOSCOPY  03/30/2004   VEL:[id number] rectum.Somewhat noncompliant left colon/Otherwise, normal colonic mucosa  . COLONOSCOPY N/A 05/31/2013   Procedure: COLONOSCOPY;  Surgeon: Daneil Dolin, MD;  [facility];  Service: Endoscopy;  Laterality: N/A;  8:45  . RIGHT OOPHORECTOMY  1982   endometriosis    There were no vitals filed for this visit.  Subjective Assessment - 03/19/18 0846    Subjective  "no pain only discomfort with stretching but it seems to be in new places. I am able to sleeping better and I am able to turn over without issue"     Currently in Pain?  No/denies    Pain Location  Shoulder    Pain Orientation  Right    Pain Onset  More than a month ago    Aggravating Factors   N/A    Pain Relieving Factors  N/A         [facility] PT Assessment - 03/19/18 0855      Observation/Other Assessments   Focus on Therapeutic Outcomes (FOTO)   28% limited      AROM   Right Shoulder Extension  50 Degrees    Right Shoulder Flexion  118 Degrees    Right Shoulder ABduction  100  Degrees    Right Shoulder Internal Rotation  55 Degrees    Right Shoulder External Rotation  65 Degrees      Strength   Right Shoulder Flexion  4/5    Right Shoulder Extension  4+/5    Right Shoulder ABduction  3+/5 within available ROM    Right Shoulder Internal Rotation  4+/5    Right Shoulder External Rotation  4/5 discomfort during testing                   [facility] Adult PT Treatment/Exercise - 03/19/18 0849      Shoulder Exercises: Standing   Other Standing Exercises  wall washes 3 x 10 CW/CCW forward/ horizontal abd    Other Standing Exercises  towel IR over shoulder 10 hold 10 sec ea.       Shoulder Exercises: ROM/Strengthening   UBE (Upper Arm Bike)  L4 x 6 min  changing direction at 3 min       Cryotherapy   Number Minutes Cryotherapy  10 Minutes    Cryotherapy Location  Shoulder    Type of Cryotherapy  Ice pack      Manual Therapy  Joint Mobilization  GHJ Grade 4 PA (in prone) combined with IR, infererior mobs grade 4    Passive ROM  IR working into end ranges in prone               PT Short Term Goals - 03/02/18 0911      PT SHORT TERM GOAL #1   Title  pt to be I with intial HEP    Time  3    Period  Weeks    Status  Achieved      PT SHORT TERM GOAL #2   Title  pt to verbalize/ demo proper posture and lifting mechanics/ techniques to prevent and reduce R shoulder pain    Time  3    Period  Weeks    Status  Achieved      PT SHORT TERM GOAL #3   Title  increase R grip strength by >/ 10# to demo imrpovement in shoulder function     Time  3    Period  Weeks    Status  Achieved        PT Long Term Goals - 03/17/18 1324      PT LONG TERM GOAL #1   Title  increase R shoulder flexion/ abduction to >/= 120 degrees and IR/ER to >/= 60 degrees (assessed in 90/90) for functional mobility with </= 2/10 pain    Time  6    Period  Weeks    Status  Unable to assess      PT LONG TERM GOAL #2   Title  improve R shoulder strength to >/= 4+/5  in all planes to promote stability and assist with lifting and carrying activities     Time  6    Period  Weeks    Status  Unable to assess      PT LONG TERM GOAL #3   Title  pt to be abel to lift/ lower from to and from an overhead shelf >/= 10#, push/pull >/= 20# for functional strength required for ADLs with </= 2/10  pain    Time  6    Period  Weeks    Status  Unable to assess      PT LONG TERM GOAL #4   Title  increase FOTO score to </= 29% limited to demo improvement in function    Time  6    Period  Weeks    Status  Unable to assess      PT LONG TERM GOAL #5   Baseline  independent with exercises issued so far    Time  6    Period  Weeks    Status  On-going            Plan - 03/19/18 0930    Clinical Impression Statement  [name] continues to make progression with shoulder mobility and strength, and is improving FOTO outcome. Focused session primarily on shoulder IR and abduction with manual techniques and shoulder strengthening to maintain ROM. pt was able to reach with RUE to T12 at end of session. utilized ice end of session to calm down soreness.     PT Next Visit Plan  update HEP PRN, shoulder mobs,focus on IR and abduction  Check doorway stretch  and thoracic extension stretches,  Consider working toward [place] activities.  Lifting etc.    PT Home Exercise Plan  upper trap stretch, wand flexion/ abduction, and 3-way bicep stretching, self inferior mobs.  with added rolled towel.  Shoulder IR/ER,  doorway stretch,  Thoracic extension over rolled towel stretch with retraction.    Consulted and Agree with Plan of Care  Patient       Patient will benefit from skilled therapeutic intervention in order to improve the following deficits and impairments:     Visit Diagnosis: Chronic right shoulder pain  Stiffness of right shoulder, not elsewhere classified  Muscle weakness (generalized)     Problem List Patient Active Problem List   Diagnosis Date Noted   . Normocytic anemia 05/10/2013  . FH: colon cancer 05/10/2013   Starr Lake PT, DPT, LAT, ATC  03/19/18  9:32 AM      [facility] [address] [place], [place], [address] Phone: [phone]   Fax:  [phone]  Name: Gabrielle Chase MRN: [id number] Date of Birth: Nov 03, 1960

## 2018-03-23 DIAGNOSIS — M779 Enthesopathy, unspecified: Secondary | ICD-10-CM | POA: Diagnosis not present

## 2018-03-23 DIAGNOSIS — M353 Polymyalgia rheumatica: Secondary | ICD-10-CM | POA: Diagnosis not present

## 2018-03-23 DIAGNOSIS — M79601 Pain in right arm: Secondary | ICD-10-CM | POA: Diagnosis not present

## 2018-03-23 DIAGNOSIS — Z7952 Long term (current) use of systemic steroids: Secondary | ICD-10-CM | POA: Diagnosis not present

## 2018-03-23 DIAGNOSIS — R768 Other specified abnormal immunological findings in serum: Secondary | ICD-10-CM | POA: Diagnosis not present

## 2018-03-23 DIAGNOSIS — M1990 Unspecified osteoarthritis, unspecified site: Secondary | ICD-10-CM | POA: Diagnosis not present

## 2018-03-25 ENCOUNTER — Encounter: Payer: Self-pay | Admitting: Physical Therapy

## 2018-03-25 ENCOUNTER — Ambulatory Visit: Payer: 59 | Attending: Rheumatology | Admitting: Physical Therapy

## 2018-03-25 DIAGNOSIS — G8929 Other chronic pain: Secondary | ICD-10-CM | POA: Insufficient documentation

## 2018-03-25 DIAGNOSIS — M6281 Muscle weakness (generalized): Secondary | ICD-10-CM | POA: Diagnosis present

## 2018-03-25 DIAGNOSIS — M25611 Stiffness of right shoulder, not elsewhere classified: Secondary | ICD-10-CM | POA: Diagnosis present

## 2018-03-25 DIAGNOSIS — M25511 Pain in right shoulder: Secondary | ICD-10-CM | POA: Diagnosis present

## 2018-03-25 NOTE — Therapy (Signed)
[place], [place], [address] Phone: [phone]   Fax:  [phone]  Physical Therapy Treatment  Patient Details  Name: Gabrielle Chase MRN: [id number] Date of Birth: August 02, 1961 Referring Provider: Sabino Niemann  MD   Encounter Date: 03/25/2018  PT End of Session - 03/25/18 1258    Visit Number  16    Number of Visits  18    Date for PT Re-Evaluation  04/13/18    PT Start Time  1104    PT Stop Time  2423    PT Time Calculation (min)  52 min    Behavior During Therapy  [facility] for tasks assessed/performed       Past Medical History:  Diagnosis Date  . Polymyalgia rheumatica ([place])   . Seasonal allergies     Past Surgical History:  Procedure Laterality Date  . ABLATION ON ENDOMETRIOSIS  1993  . BREAST EXCISIONAL BIOPSY    . CESAREAN SECTION  1989  . COLONOSCOPY  03/30/2004   NTI:[id number] rectum.Somewhat noncompliant left colon/Otherwise, normal colonic mucosa  . COLONOSCOPY N/A 05/31/2013   Procedure: COLONOSCOPY;  Surgeon: Daneil Dolin, MD;  [facility];  Service: Endoscopy;  Laterality: N/A;  8:45  . RIGHT OOPHORECTOMY  1982   endometriosis    There were no vitals filed for this visit.  Subjective Assessment - 03/25/18 1108    Subjective  No pain just sore.  Sore from last stretches not bad.   Doing the exercises    Currently in Pain?  No/denies    Pain Location  Shoulder    Pain Orientation  Right    Pain Descriptors / Indicators  Sore    Aggravating Factors   stretching  end range. Using new positions she has not been in  or have not done in awhile.      Pain Relieving Factors  heat                       [facility] Adult PT Treatment/Exercise - 03/25/18 0001      Shoulder Exercises: Pulleys   Flexion  1 minute    ABduction  2 minutes      Shoulder Exercises: ROM/Strengthening   UBE (Upper Arm Bike)  L4 x 6 min  changing direction at 3 min       Moist Heat  Therapy   Number Minutes Moist Heat  10 Minutes    Moist Heat Location  Shoulder      Manual Therapy   Joint Mobilization  4 way grade 2-3    Soft tissue mobilization  soft tissue work shoulder girdle    Scapular Mobilization  YES    Passive ROM  PROM ER.  ABD             PT Education - 03/25/18 1258    Education provided  Yes    Education Details  waysto stretch during the day.    Person(s) Educated  Patient    Methods  Explanation    Comprehension  Verbalized understanding       PT Short Term Goals - 03/02/18 0911      PT SHORT TERM GOAL #1   Title  pt to be I with intial HEP    Time  3    Period  Weeks    Status  Achieved      PT SHORT TERM GOAL #2   Title  pt to verbalize/ demo  proper posture and lifting mechanics/ techniques to prevent and reduce R shoulder pain    Time  3    Period  Weeks    Status  Achieved      PT SHORT TERM GOAL #3   Title  increase R grip strength by >/ 10# to demo imrpovement in shoulder function     Time  3    Period  Weeks    Status  Achieved        PT Long Term Goals - 03/17/18 1324      PT LONG TERM GOAL #1   Title  increase R shoulder flexion/ abduction to >/= 120 degrees and IR/ER to >/= 60 degrees (assessed in 90/90) for functional mobility with </= 2/10 pain    Time  6    Period  Weeks    Status  Unable to assess      PT LONG TERM GOAL #2   Title  improve R shoulder strength to >/= 4+/5 in all planes to promote stability and assist with lifting and carrying activities     Time  6    Period  Weeks    Status  Unable to assess      PT LONG TERM GOAL #3   Title  pt to be abel to lift/ lower from to and from an overhead shelf >/= 10#, push/pull >/= 20# for functional strength required for ADLs with </= 2/10  pain    Time  6    Period  Weeks    Status  Unable to assess      PT LONG TERM GOAL #4   Title  increase FOTO score to </= 29% limited to demo improvement in function    Time  6    Period  Weeks    Status   Unable to assess      PT LONG TERM GOAL #5   Baseline  independent with exercises issued so far    Time  6    Period  Weeks    Status  On-going            Plan - 03/25/18 1259    Clinical Impression Statement  patient gets stiff between session, however she was able to return to at least T12 at end of session.  Manual main focus today.  no pain at end of session.  Noted less tension in shoulder    PT Next Visit Plan  update HEP PRN, shoulder mobs,focus on IR and abduction  Check doorway stretch  and thoracic extension stretches,  Consider working toward [place] activities.  Lifting etc.    PT Home Exercise Plan  upper trap stretch, wand flexion/ abduction, and 3-way bicep stretching, self inferior mobs.  with added rolled towel. Shoulder IR/ER,  doorway stretch,  Thoracic extension over rolled towel stretch with retraction.    Consulted and Agree with Plan of Care  Patient       Patient will benefit from skilled therapeutic intervention in order to improve the following deficits and impairments:     Visit Diagnosis: Chronic right shoulder pain  Stiffness of right shoulder, not elsewhere classified  Muscle weakness (generalized)     Problem List Patient Active Problem List   Diagnosis Date Noted  . Normocytic anemia 05/10/2013  . FH: colon cancer 05/10/2013    Torra Pala PTA 03/25/2018, 1:02 PM  [facility] [address] [place], [place], [address] Phone: [phone]   Fax:  [phone]  Name: Gabrielle Chase MRN: [id number] Date of Birth: January 19, 1961

## 2018-03-30 ENCOUNTER — Encounter: Payer: Self-pay | Admitting: Physical Therapy

## 2018-03-30 ENCOUNTER — Ambulatory Visit: Payer: 59 | Admitting: Physical Therapy

## 2018-03-30 DIAGNOSIS — G8929 Other chronic pain: Secondary | ICD-10-CM | POA: Diagnosis not present

## 2018-03-30 DIAGNOSIS — M25611 Stiffness of right shoulder, not elsewhere classified: Secondary | ICD-10-CM

## 2018-03-30 DIAGNOSIS — M25511 Pain in right shoulder: Principal | ICD-10-CM

## 2018-03-30 DIAGNOSIS — M6281 Muscle weakness (generalized): Secondary | ICD-10-CM

## 2018-03-30 NOTE — Therapy (Signed)
[place], [place], [address] Phone: [phone]   Fax:  [phone]  Physical Therapy Treatment  Patient Details  Name: Gabrielle Chase MRN: [id number] Date of Birth: Feb 08, 1961 Referring Provider: Sabino Niemann  MD   Encounter Date: 03/30/2018  PT End of Session - 03/30/18 1327    Visit Number  17    Number of Visits  18    Date for PT Re-Evaluation  04/13/18    PT Start Time  3474    PT Stop Time  1413    PT Time Calculation (min)  45 min    Activity Tolerance  Patient tolerated treatment well    Behavior During Therapy  [facility] for tasks assessed/performed       Past Medical History:  Diagnosis Date  . Polymyalgia rheumatica ([place])   . Seasonal allergies     Past Surgical History:  Procedure Laterality Date  . ABLATION ON ENDOMETRIOSIS  1993  . BREAST EXCISIONAL BIOPSY    . CESAREAN SECTION  1989  . COLONOSCOPY  03/30/2004   QVZ:[id number] rectum.Somewhat noncompliant left colon/Otherwise, normal colonic mucosa  . COLONOSCOPY N/A 05/31/2013   Procedure: COLONOSCOPY;  Surgeon: Daneil Dolin, MD;  [facility];  Service: Endoscopy;  Laterality: N/A;  8:45  . RIGHT OOPHORECTOMY  1982   endometriosis    There were no vitals filed for this visit.  Subjective Assessment - 03/30/18 1327    Subjective  "no pain, I have been working on my towel exercise. I am very thrilled"     Currently in Pain?  No/denies                       [facility] Adult PT Treatment/Exercise - 03/30/18 1333      Shoulder Exercises: Standing   Internal Rotation  Strengthening;Right;15 reps;Theraband    Theraband Level (Shoulder Internal Rotation)  Level 3 (Green) added focus on eccentrics    Flexion  Right;10 reps;Weights raching in to lowest cabinet/ middle cabinet 2#/#    Shoulder Flexion Weight (lbs)  2#, 3#    ABduction  10 reps;Weights    Shoulder ABduction Weight (lbs)  2#, 3#      Shoulder  Exercises: Pulleys   Other Pulley Exercises  IR x 2 min      Shoulder Exercises: ROM/Strengthening   UBE (Upper Arm Bike)  L5 x 22min  changing direction at 3 min    Other ROM/Strengthening Exercises  lat pull down 2 x 15 with 20#    Other ROM/Strengthening Exercises  Rows 2 x 15 20# cues to avoid hiking shoulder      Shoulder Exercises: Stretch   Other Shoulder Stretches  door way pec stretch 2 x 30 sec,     Other Shoulder Stretches  3 way bicep stretching holding ea. 30 sec      Manual Therapy   Joint Mobilization  anterior grade 3 combined with IR               PT Short Term Goals - 03/02/18 0911      PT SHORT TERM GOAL #1   Title  pt to be I with intial HEP    Time  3    Period  Weeks    Status  Achieved      PT SHORT TERM GOAL #2   Title  pt to verbalize/ demo proper posture and lifting mechanics/ techniques to prevent and reduce R  shoulder pain    Time  3    Period  Weeks    Status  Achieved      PT SHORT TERM GOAL #3   Title  increase R grip strength by >/ 10# to demo imrpovement in shoulder function     Time  3    Period  Weeks    Status  Achieved        PT Long Term Goals - 03/17/18 1324      PT LONG TERM GOAL #1   Title  increase R shoulder flexion/ abduction to >/= 120 degrees and IR/ER to >/= 60 degrees (assessed in 90/90) for functional mobility with </= 2/10 pain    Time  6    Period  Weeks    Status  Unable to assess      PT LONG TERM GOAL #2   Title  improve R shoulder strength to >/= 4+/5 in all planes to promote stability and assist with lifting and carrying activities     Time  6    Period  Weeks    Status  Unable to assess      PT LONG TERM GOAL #3   Title  pt to be abel to lift/ lower from to and from an overhead shelf >/= 10#, push/pull >/= 20# for functional strength required for ADLs with </= 2/10  pain    Time  6    Period  Weeks    Status  Unable to assess      PT LONG TERM GOAL #4   Title  increase FOTO score to </= 29%  limited to demo improvement in function    Time  6    Period  Weeks    Status  Unable to assess      PT LONG TERM GOAL #5   Baseline  independent with exercises issued so far    Time  6    Period  Weeks    Status  On-going            Plan - 03/30/18 1410    Clinical Impression Statement  Mrs. Poss continues to make great progress with physical therapy. continued shoulder mobs focusing on shoulder IR and reaching behinding the back. continued strenghtenging focus on shoulder mobility and scapular stability and maintained the ROM achieved throughout session. She was able to reach behined back to T10, and reported no increase in pain.     Rehab Potential  Good    PT Next Visit Plan  update HEP PRN, shoulder mobs,focus on IR and abduction  Check doorway stretch  and thoracic extension stretches,  Consider working toward [place] activities.  Lifting etc.    PT Home Exercise Plan  upper trap stretch, wand flexion/ abduction, and 3-way bicep stretching, self inferior mobs.  with added rolled towel. Shoulder IR/ER,  doorway stretch,  Thoracic extension over rolled towel stretch with retraction.    Consulted and Agree with Plan of Care  Patient       Patient will benefit from skilled therapeutic intervention in order to improve the following deficits and impairments:  Pain, Increased fascial restricitons, Decreased strength, Decreased range of motion, Decreased activity tolerance, Decreased endurance, Postural dysfunction, Improper body mechanics, Impaired UE functional use  Visit Diagnosis: Chronic right shoulder pain  Stiffness of right shoulder, not elsewhere classified  Muscle weakness (generalized)     Problem List Patient Active Problem List   Diagnosis Date Noted  . Normocytic anemia 05/10/2013  .  FH: colon cancer 05/10/2013   Starr Lake PT, DPT, LAT, ATC  03/30/18  2:15 PM      [facility]  [place], [place], [address] Phone: [phone]   Fax:  [phone]  Name: KENZEE BASSIN MRN: [id number] Date of Birth: 1961/01/05

## 2018-04-02 ENCOUNTER — Encounter: Payer: Self-pay | Admitting: Physical Therapy

## 2018-04-02 ENCOUNTER — Ambulatory Visit: Payer: 59 | Admitting: Physical Therapy

## 2018-04-02 DIAGNOSIS — M25511 Pain in right shoulder: Secondary | ICD-10-CM | POA: Diagnosis not present

## 2018-04-02 DIAGNOSIS — M6281 Muscle weakness (generalized): Secondary | ICD-10-CM

## 2018-04-02 DIAGNOSIS — M25611 Stiffness of right shoulder, not elsewhere classified: Secondary | ICD-10-CM | POA: Diagnosis not present

## 2018-04-02 DIAGNOSIS — G8929 Other chronic pain: Secondary | ICD-10-CM

## 2018-04-02 NOTE — Therapy (Signed)
[place], [place], [address] Phone: [phone]   Fax:  [phone]  Physical Therapy Treatment / Discharge summary  Patient Details  Name: Gabrielle Chase MRN: [id number] Date of Birth: 1961-07-05 Referring Provider: Sabino Niemann  MD   Encounter Date: 04/02/2018  PT End of Session - 04/02/18 0840    Visit Number  18    Number of Visits  18    Date for PT Re-Evaluation  04/13/18    PT Start Time  0800    PT Stop Time  0840    PT Time Calculation (min)  40 min    Activity Tolerance  Patient tolerated treatment well    Behavior During Therapy  [facility] for tasks assessed/performed       Past Medical History:  Diagnosis Date  . Polymyalgia rheumatica ([place])   . Seasonal allergies     Past Surgical History:  Procedure Laterality Date  . ABLATION ON ENDOMETRIOSIS  1993  . BREAST EXCISIONAL BIOPSY    . CESAREAN SECTION  1989  . COLONOSCOPY  03/30/2004   [id number] rectum.Somewhat noncompliant left colon/Otherwise, normal colonic mucosa  . COLONOSCOPY N/A 05/31/2013   Procedure: COLONOSCOPY;  Surgeon: Daneil Dolin, MD;  [facility];  Service: Endoscopy;  Laterality: N/A;  8:45  . RIGHT OOPHORECTOMY  1982   endometriosis    There were no vitals filed for this visit.  Subjective Assessment - 04/02/18 0759    Subjective  "doing well, some soreness from the exercise and I did get a massage"     Currently in Pain?  No/denies    Pain Orientation  Right    Aggravating Factors   reaching into the back seat,     Pain Relieving Factors  stretching, exercise         [facility] PT Assessment - 04/02/18 0817      Observation/Other Assessments   Focus on Therapeutic Outcomes (FOTO)   25% limited      AROM   Right Shoulder Extension  50 Degrees    Right Shoulder Flexion  124 Degrees    Right Shoulder ABduction  118 Degrees    Right Shoulder Internal Rotation  -- T12    Right Shoulder External  Rotation  73 Degrees assessed at 90/90      Strength   Right Shoulder Flexion  4+/5    Right Shoulder Extension  5/5    Right Shoulder ABduction  4/5    Right Shoulder Internal Rotation  4+/5    Right Shoulder External Rotation  4+/5                   [facility] Adult PT Treatment/Exercise - 04/02/18 0806      Shoulder Exercises: Supine   Other Supine Exercises  scapular retraction in supine with towel roll between shoulder blades to promote pec stretch 1 x 10 holding 3 sec ea.      Shoulder Exercises: ROM/Strengthening   UBE (Upper Arm Bike)  L3 x 6 min  changing direction     Lat Pull  -- 2 x 10 20# cues for proper form    Other ROM/Strengthening Exercises  chest press  1 x 10 20#    Other ROM/Strengthening Exercises  Rows 2 x 15 20#      Shoulder Exercises: Stretch   Other Shoulder Stretches  pec stretch supine with towel underneath  PNF contract/ relax with 10 sec hold    Other  Shoulder Stretches  3 way bicep stretching holding ea. 30 sec             PT Education - 04/02/18 0841    Education provided  Yes    Education Details  reviewed previously provided HEP, how to progress strengthening to promote endurance and mobility. being more proactive versus reactive, and benefits of continued exercise and starting and exercise program.     Person(s) Educated  Patient    Methods  Explanation;Verbal cues;Demonstration    Comprehension  Verbalized understanding;Verbal cues required;Returned demonstration       PT Short Term Goals - 03/02/18 0911      PT SHORT TERM GOAL #1   Title  pt to be I with intial HEP    Time  3    Period  Weeks    Status  Achieved      PT SHORT TERM GOAL #2   Title  pt to verbalize/ demo proper posture and lifting mechanics/ techniques to prevent and reduce R shoulder pain    Time  3    Period  Weeks    Status  Achieved      PT SHORT TERM GOAL #3   Title  increase R grip strength by >/ 10# to demo imrpovement in shoulder function      Time  3    Period  Weeks    Status  Achieved        PT Long Term Goals - 04/02/18 6948      PT LONG TERM GOAL #1   Title  increase R shoulder flexion/ abduction to >/= 120 degrees and IR/ER to >/= 60 degrees (assessed in 90/90) for functional mobility with </= 2/10 pain    Baseline  abduction 118,     Period  Weeks    Status  Partially Met      PT LONG TERM GOAL #2   Title  improve R shoulder strength to >/= 4+/5 in all planes to promote stability and assist with lifting and carrying activities     Baseline  4/5 abduction     Status  Partially Met      PT LONG TERM GOAL #3   Title  pt to be abel to lift/ lower from to and from an overhead shelf >/= 10#, push/pull >/= 20# for functional strength required for ADLs with </= 2/10  pain    Time  6    Period  Weeks    Status  Achieved      PT LONG TERM GOAL #4   Title  increase FOTO score to </= 29% limited to demo improvement in function    Time  6    Period  Weeks    Status  Achieved      PT LONG TERM GOAL #5   Title  pt to be I with all HEP given as of last visit to maintain and progress current level of function     Period  Weeks    Status  Achieved            Plan - 04/02/18 0840    Clinical Impression Statement  Mrs Littrell has made great progress with  physical therapy increasing shoulder mobility and strength and additionally reports no pain. She continues to demonstrate stiffness with abduction and IR but is able to progress ROM with stretching. she met or partially met all goals today  and is able to maintain current level of function independenlty and will be  discharged from PT today.     PT Next Visit Plan  D/C    PT Home Exercise Plan  upper trap stretch, wand flexion/ abduction, and 3-way bicep stretching, self inferior mobs.  with added rolled towel. Shoulder IR/ER,  doorway stretch,  Thoracic extension over rolled towel stretch with retraction.    Consulted and Agree with Plan of Care  Patient        Patient will benefit from skilled therapeutic intervention in order to improve the following deficits and impairments:  Pain, Increased fascial restricitons, Decreased strength, Decreased range of motion, Decreased activity tolerance, Decreased endurance, Postural dysfunction, Improper body mechanics, Impaired UE functional use  Visit Diagnosis: Chronic right shoulder pain  Stiffness of right shoulder, not elsewhere classified  Muscle weakness (generalized)     Problem List Patient Active Problem List   Diagnosis Date Noted  . Normocytic anemia 05/10/2013  . FH: colon cancer 05/10/2013   Starr Lake PT, DPT, LAT, ATC  04/02/18  8:43 AM      [facility] [address] [place], [place], [address] Phone: [phone]   Fax:  [phone]  Name: ALESSA MAZUR MRN: [id number] Date of Birth: [birth year]      PHYSICAL THERAPY DISCHARGE SUMMARY  Visits from Start of Care: 18  Current functional level related to goals / functional outcomes: See goals, FOTO 25% limited   Remaining deficits: Intermittent stiffness in the R shoulder, limited with abduction/ IR. See note   Education / Equipment: HEP, theraband, posture, anatomy  Plan: Patient agrees to discharge.  Patient goals were partially met. Patient is being discharged due to being pleased with the current functional level.  ?????     Dinesha Twiggs PT, DPT, LAT, ATC  04/02/18  8:44 AM

## 2018-04-06 ENCOUNTER — Ambulatory Visit: Payer: 59 | Admitting: Physical Therapy

## 2018-04-07 MED FILL — predniSONE 5 MG TABS: 5 | 30 days supply | Qty: 30 | Fill #4

## 2018-04-09 ENCOUNTER — Encounter: Payer: 59 | Admitting: Physical Therapy

## 2018-04-27 ENCOUNTER — Encounter: Payer: Self-pay | Admitting: Internal Medicine

## 2018-05-19 ENCOUNTER — Other Ambulatory Visit: Payer: Self-pay | Admitting: Family Medicine

## 2018-05-19 DIAGNOSIS — Z1231 Encounter for screening mammogram for malignant neoplasm of breast: Secondary | ICD-10-CM

## 2018-05-20 MED FILL — predniSONE 5 MG TABS: 5 | 30 days supply | Qty: 30 | Fill #5

## 2018-06-04 DIAGNOSIS — H524 Presbyopia: Secondary | ICD-10-CM | POA: Diagnosis not present

## 2018-06-11 ENCOUNTER — Ambulatory Visit
Admission: RE | Admit: 2018-06-11 | Discharge: 2018-06-11 | Disposition: A | Discharge status: Home / Self Care | Payer: 59 | Source: Ambulatory Visit | Attending: Family Medicine | Admitting: Family Medicine

## 2018-06-11 DIAGNOSIS — Z1231 Encounter for screening mammogram for malignant neoplasm of breast: Secondary | ICD-10-CM

## 2018-10-28 MED FILL — predniSONE 1 MG TABS: 1 | 90 days supply | Qty: 360 | Fill #1

## 2018-12-04 DIAGNOSIS — M79676 Pain in unspecified toe(s): Secondary | ICD-10-CM | POA: Diagnosis not present

## 2018-12-04 MED FILL — CEPHALEXIN 500 MG CAPSULE: 500 | 7 days supply | Qty: 28 | Fill #0

## 2018-12-14 MED FILL — DOXYCYCLINE HYC 100 MG CAPS: 100 | 10 days supply | Qty: 20 | Fill #0

## 2018-12-18 ENCOUNTER — Encounter: Payer: Self-pay | Admitting: Podiatry

## 2018-12-18 ENCOUNTER — Ambulatory Visit (INDEPENDENT_AMBULATORY_CARE_PROVIDER_SITE_OTHER): Payer: 59 | Admitting: Podiatry

## 2018-12-18 VITALS — BP 143/79

## 2018-12-18 DIAGNOSIS — L600 Ingrowing nail: Secondary | ICD-10-CM

## 2018-12-18 NOTE — Patient Instructions (Signed)

## 2018-12-19 NOTE — Progress Notes (Signed)
Subjective:   Patient ID: Gabrielle Chase, female   DOB: 58 y.o.   MRN: [id number]   HPI Patient states she is had trouble with her nail for years on her big toe right foot and it started to become painful and ingrown in the corner and there was some drainage and she had been on an antibiotic and that has resolved but the pain remains.  Patient does not smoke and likes to be active   Review of Systems  All other systems reviewed and are negative.       Objective:  Physical Exam Vitals signs and nursing note reviewed.  Constitutional:      Appearance: She is well-developed.  Pulmonary:     Effort: Pulmonary effort is normal.  Musculoskeletal: Normal range of motion.  Skin:    General: Skin is warm.  Neurological:     Mental Status: She is alert.     Neurovascular status intact muscle strength is adequate range of motion within normal limits with patient found to have a thickened yellow right hallux nail with incurvated lateral border that is painful when pressed and make shoe gear difficult.  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  Patient wants surgery understanding risk and signed consent form and today I went ahead and infiltrated the right hallux 60 mg like Marcaine mixture removed the lateral border exposed matrix and applied phenol 3 applications 30 seconds followed by alcohol lavage and sterile dressing.  Gave instructions on soaks and reappoint to recheck and instructed on leaving dressing on for 24 hours but to take it off earlier if issues should occur.  Drops were prescribed for patient

## 2019-03-24 DIAGNOSIS — M353 Polymyalgia rheumatica: Secondary | ICD-10-CM | POA: Diagnosis not present

## 2019-03-24 DIAGNOSIS — M1990 Unspecified osteoarthritis, unspecified site: Secondary | ICD-10-CM | POA: Diagnosis not present

## 2019-03-24 DIAGNOSIS — M8580 Other specified disorders of bone density and structure, unspecified site: Secondary | ICD-10-CM | POA: Diagnosis not present

## 2019-03-24 DIAGNOSIS — R768 Other specified abnormal immunological findings in serum: Secondary | ICD-10-CM | POA: Diagnosis not present

## 2019-05-14 MED FILL — PREVIDENT 5000 1.1% DRY MOU: 1.1 | 30 days supply | Qty: 1 | Fill #0

## 2019-05-18 DIAGNOSIS — L820 Inflamed seborrheic keratosis: Secondary | ICD-10-CM | POA: Diagnosis not present

## 2019-05-20 ENCOUNTER — Other Ambulatory Visit: Payer: Self-pay | Admitting: Family Medicine

## 2019-05-20 DIAGNOSIS — Z1231 Encounter for screening mammogram for malignant neoplasm of breast: Secondary | ICD-10-CM

## 2019-06-22 DIAGNOSIS — T63461A Toxic effect of venom of wasps, accidental (unintentional), initial encounter: Secondary | ICD-10-CM | POA: Diagnosis not present

## 2019-06-22 MED FILL — predniSONE 20 MG TABS: 20 | 7 days supply | Qty: 15 | Fill #0

## 2019-07-01 ENCOUNTER — Ambulatory Visit: Payer: 59

## 2019-08-09 ENCOUNTER — Other Ambulatory Visit: Payer: Self-pay

## 2019-08-09 ENCOUNTER — Ambulatory Visit
Admission: RE | Admit: 2019-08-09 | Discharge: 2019-08-09 | Disposition: A | Discharge status: Home / Self Care | Payer: 59 | Source: Ambulatory Visit | Attending: Family Medicine | Admitting: Family Medicine

## 2019-08-09 DIAGNOSIS — Z1231 Encounter for screening mammogram for malignant neoplasm of breast: Secondary | ICD-10-CM

## 2019-08-11 DIAGNOSIS — M1990 Unspecified osteoarthritis, unspecified site: Secondary | ICD-10-CM | POA: Diagnosis not present

## 2019-08-11 DIAGNOSIS — M064 Inflammatory polyarthropathy: Secondary | ICD-10-CM | POA: Diagnosis not present

## 2019-08-11 DIAGNOSIS — M353 Polymyalgia rheumatica: Secondary | ICD-10-CM | POA: Diagnosis not present

## 2019-08-11 DIAGNOSIS — R768 Other specified abnormal immunological findings in serum: Secondary | ICD-10-CM | POA: Diagnosis not present

## 2019-08-11 DIAGNOSIS — M8580 Other specified disorders of bone density and structure, unspecified site: Secondary | ICD-10-CM | POA: Diagnosis not present

## 2019-11-10 MED FILL — PREVIDENT 5000 1.1% DRY MOU: 1.1 | 30 days supply | Qty: 100 | Fill #1

## 2020-01-08 ENCOUNTER — Ambulatory Visit (INDEPENDENT_AMBULATORY_CARE_PROVIDER_SITE_OTHER)
Admission: RE | Admit: 2020-01-08 | Discharge: 2020-01-08 | Disposition: A | Discharge status: Home / Self Care | Payer: 59 | Source: Ambulatory Visit

## 2020-01-08 DIAGNOSIS — M545 Low back pain, unspecified: Secondary | ICD-10-CM

## 2020-01-08 MED ORDER — MELOXICAM 7.5 MG PO TABS: 7.5000 mg | ORAL_TABLET | Freq: Every day | ORAL | 0 refills | Status: DC

## 2020-01-08 MED ORDER — TIZANIDINE HCL 2 MG PO TABS: 2.0000 mg | ORAL_TABLET | Freq: Three times a day (TID) | ORAL | 0 refills | Status: DC | PRN

## 2020-01-08 NOTE — ED Provider Notes (Signed)
Virtual Visit via Video Note:  Gabrielle Chase  initiated request for Telemedicine visit with [facility] Ur[facility] Care team. I connected with Gabrielle Chase  on 01/08/2020 at 11:27 AM  for a synchronized telemedicine visit using a video enabled HIPPA compliant telemedicine application. I verified that I am speaking with Gabrielle Chase  using two identifiers. Gabrielle Galligan Gabrielle Cipro, PA-C  was physically located in a [facility] Urgent care site and Gabrielle Chase was located at a different location.   The limitations of evaluation and management by telemedicine as well as the availability of in-person appointments were discussed. Patient was informed that she  may incur a bill ( including co-pay) for this virtual visit encounter. Gabrielle Chase  expressed understanding and gave verbal consent to proceed with virtual visit.     History of Present Illness:Gabrielle Chase  is a 59 y.o. female presents with 2 day history of right low back pain. She was bending over yesterday when she felt a catch, the pain was initially bilateral, and now is more right sided. She has been taking advil 600mg  q6h and heat with temporary relief, but woke up this morning with worse pain and therefore made the appointment. States pain was most severe waking up this morning, but slightly eased off after advil. She is able to get into somewhat comfortable positions where pain is minimal. Pain is worse with movement and walking. Occasional mild radiation up the back. Denies radiations to the legs, saddle anesthesia, loss of bladder or bowel control.  Denies leg weakness.  Past Medical History:  Diagnosis Date  . Polymyalgia rheumatica ([place])   . Seasonal allergies     No Known Allergies      Observations/Objective: General: Well appearing, nontoxic, no acute distress. Sitting comfortably. Head: Normocephalic, atraumatic Eye: No conjunctival injection, eyelid swelling. EOMI ENT: Mucus membranes moist, no lip  cracking. No obvious nasal drainage. Pulm: Speaking in full sentences without difficulty. Normal effort. No respiratory distress, accessory muscle use. MSK: was able observe walking without obvious changes in gait. Neuro: Normal mental status. Alert and oriented x 3.  Assessment and Plan: Will continue NSAIDs and add Muscle relaxant as needed. Ice/heat compresses. Discussed with patient this can take up to 3-4 weeks to resolve, but should be getting better each week. To follow up for in person evaluation if symptoms not improving. Return precautions given. Patient expresses understanding and agrees to plan.   Follow Up Instructions:    I discussed the assessment and treatment plan with the patient. The patient was provided an opportunity to ask questions and all were answered. The patient agreed with the plan and demonstrated an understanding of the instructions.   The patient was advised to call back or seek an in-person evaluation if the symptoms worsen or if the condition fails to improve as anticipated.  I provided 15 minutes of non-face-to-face time during this encounter.    Ok Edwards, PA-C  01/08/2020 11:27 AM     Ok Edwards, PA-C 01/08/20 1253

## 2020-01-08 NOTE — Discharge Instructions (Addendum)
Start Mobic. Do not take ibuprofen (motrin/advil)/ naproxen (aleve) while on mobic. Tizanidine as needed, this can make you drowsy, so do not take if you are going to drive, operate heavy machinery, or make important decisions. Ice/heat compresses as needed. This can take up to 3-4 weeks to completely resolve, but you should be feeling better each week. Follow up with PCP/orthopedics if symptoms worsen, changes for reevaluation. If experience numbness/tingling of the inner thighs, loss of bladder or bowel control, go to the emergency department for evaluation.   [facility]) [place], [place], [place] [address] [phone]  [facility] [address] Evlyn Courier [place], [place] [address] [phone]  [facility] at [facility] [address] [place], [place] [address] [phone]  [facility] [place], [place], [place], [place] [address] [phone]  [facility] [place] [address]is Everts [place], [place] [address] [phone]  [facility] at [address], [place], [place] [address] [phone]

## 2020-01-18 DIAGNOSIS — R030 Elevated blood-pressure reading, without diagnosis of hypertension: Secondary | ICD-10-CM | POA: Diagnosis not present

## 2020-01-18 DIAGNOSIS — Z13220 Encounter for screening for lipoid disorders: Secondary | ICD-10-CM | POA: Diagnosis not present

## 2020-01-18 DIAGNOSIS — Z0000 Encounter for general adult medical examination without abnormal findings: Secondary | ICD-10-CM | POA: Diagnosis not present

## 2020-01-18 DIAGNOSIS — E2839 Other primary ovarian failure: Secondary | ICD-10-CM | POA: Diagnosis not present

## 2020-01-18 DIAGNOSIS — M353 Polymyalgia rheumatica: Secondary | ICD-10-CM | POA: Diagnosis not present

## 2020-01-19 ENCOUNTER — Other Ambulatory Visit: Payer: Self-pay | Admitting: Family Medicine

## 2020-01-19 DIAGNOSIS — E2839 Other primary ovarian failure: Secondary | ICD-10-CM

## 2020-05-03 DIAGNOSIS — H52223 Regular astigmatism, bilateral: Secondary | ICD-10-CM | POA: Diagnosis not present

## 2020-05-03 DIAGNOSIS — H524 Presbyopia: Secondary | ICD-10-CM | POA: Diagnosis not present

## 2020-05-03 DIAGNOSIS — H43393 Other vitreous opacities, bilateral: Secondary | ICD-10-CM | POA: Diagnosis not present

## 2020-05-15 DIAGNOSIS — D2262 Melanocytic nevi of left upper limb, including shoulder: Secondary | ICD-10-CM | POA: Diagnosis not present

## 2020-05-15 DIAGNOSIS — D2272 Melanocytic nevi of left lower limb, including hip: Secondary | ICD-10-CM | POA: Diagnosis not present

## 2020-05-15 DIAGNOSIS — D485 Neoplasm of uncertain behavior of skin: Secondary | ICD-10-CM | POA: Diagnosis not present

## 2020-05-15 DIAGNOSIS — D2271 Melanocytic nevi of right lower limb, including hip: Secondary | ICD-10-CM | POA: Diagnosis not present

## 2020-05-15 DIAGNOSIS — D225 Melanocytic nevi of trunk: Secondary | ICD-10-CM | POA: Diagnosis not present

## 2020-05-15 DIAGNOSIS — D692 Other nonthrombocytopenic purpura: Secondary | ICD-10-CM | POA: Diagnosis not present

## 2020-05-15 DIAGNOSIS — L821 Other seborrheic keratosis: Secondary | ICD-10-CM | POA: Diagnosis not present

## 2020-05-15 DIAGNOSIS — D1801 Hemangioma of skin and subcutaneous tissue: Secondary | ICD-10-CM | POA: Diagnosis not present

## 2020-05-17 ENCOUNTER — Ambulatory Visit
Admission: RE | Admit: 2020-05-17 | Discharge: 2020-05-17 | Disposition: A | Discharge status: Home / Self Care | Payer: 59 | Source: Ambulatory Visit | Attending: Family Medicine | Admitting: Family Medicine

## 2020-05-17 DIAGNOSIS — M85851 Other specified disorders of bone density and structure, right thigh: Secondary | ICD-10-CM | POA: Diagnosis not present

## 2020-05-17 DIAGNOSIS — Z780 Asymptomatic menopausal state: Secondary | ICD-10-CM | POA: Diagnosis not present

## 2020-05-17 DIAGNOSIS — E2839 Other primary ovarian failure: Secondary | ICD-10-CM

## 2020-05-26 MED FILL — PREVIDENT 5000 1.1% DRY MOU: 1.1 | 30 days supply | Qty: 100 | Fill #0

## 2020-07-19 ENCOUNTER — Other Ambulatory Visit: Payer: Self-pay | Admitting: Family Medicine

## 2020-07-19 DIAGNOSIS — Z1231 Encounter for screening mammogram for malignant neoplasm of breast: Secondary | ICD-10-CM

## 2020-08-09 ENCOUNTER — Ambulatory Visit
Admission: RE | Admit: 2020-08-09 | Discharge: 2020-08-09 | Disposition: A | Discharge status: Home / Self Care | Payer: 59 | Source: Ambulatory Visit | Attending: Family Medicine | Admitting: Family Medicine

## 2020-08-09 ENCOUNTER — Other Ambulatory Visit: Payer: Self-pay

## 2020-08-09 DIAGNOSIS — Z1231 Encounter for screening mammogram for malignant neoplasm of breast: Secondary | ICD-10-CM

## 2020-10-23 IMAGING — MG DIGITAL SCREENING BILAT W/ TOMO W/ CAD
8 series · 8 of 24 positions shown · non-contrast
Comparison: Previous exam(s).

CLINICAL DATA: Screening.

EXAM:
DIGITAL SCREENING BILATERAL MAMMOGRAM WITH TOMO AND CAD

[L CC synth-2D]
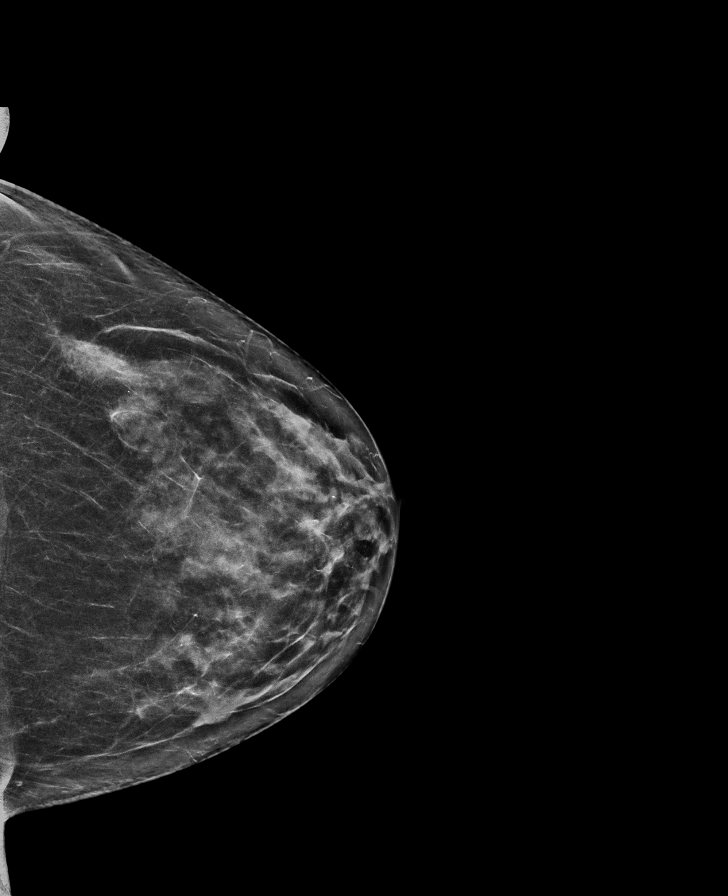

[R MLO synth-2D]
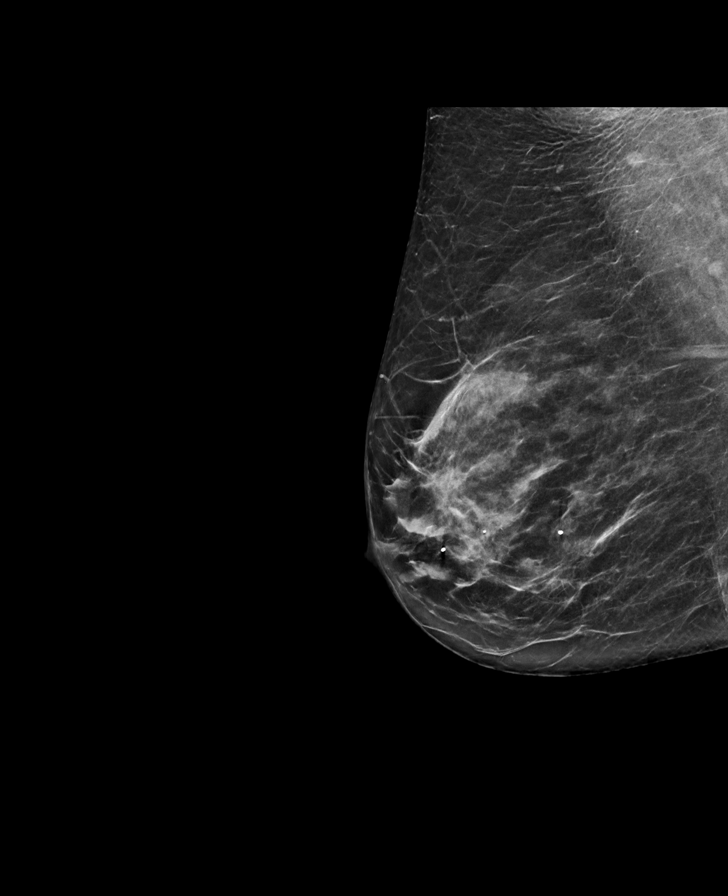

[L MLO synth-2D]
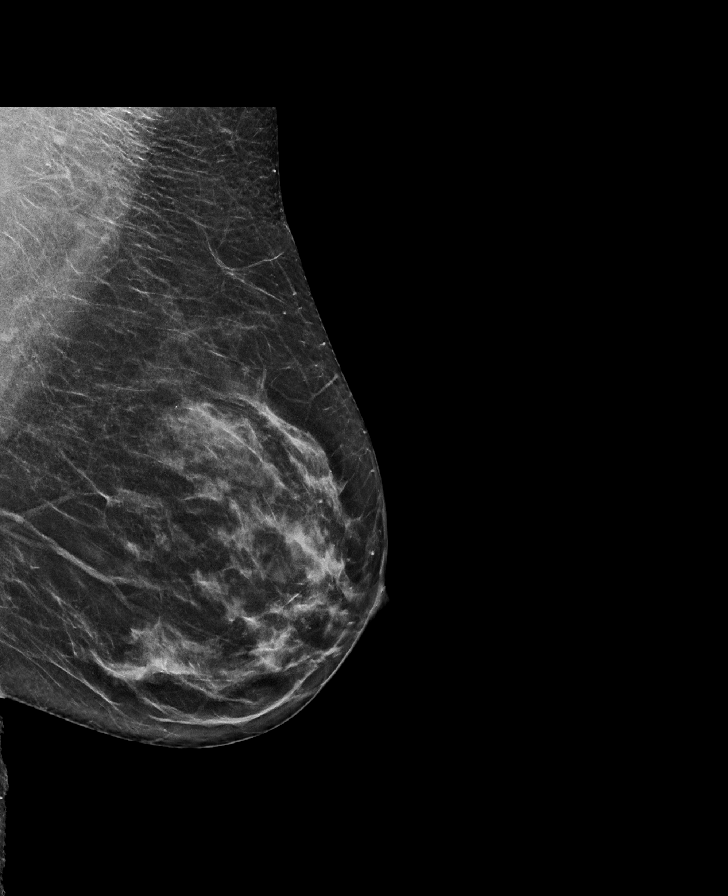

[R CC synth-2D]
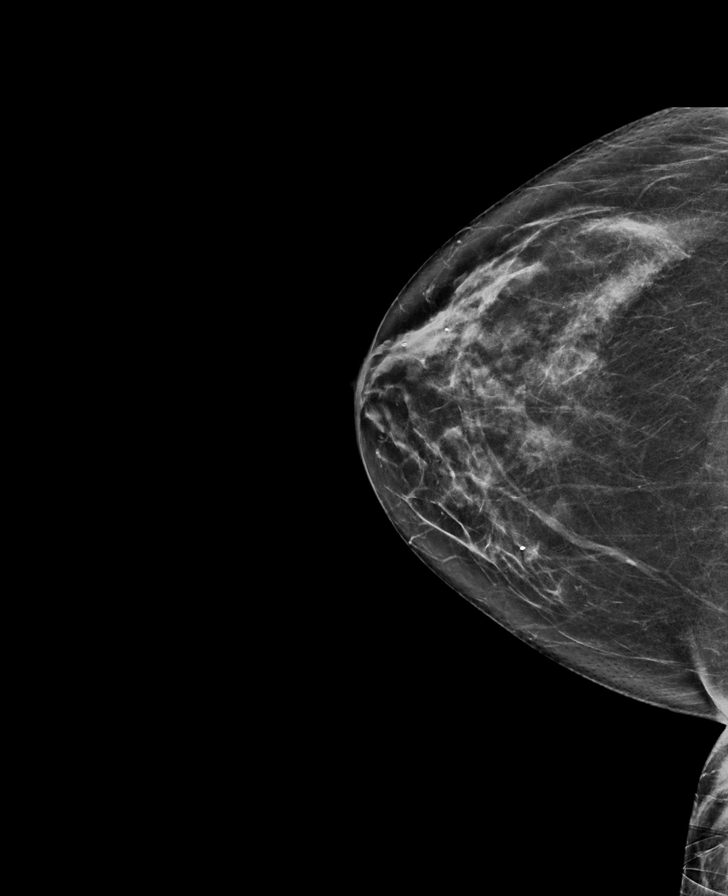

[L CC tomo · tomo slice 34/67.0]
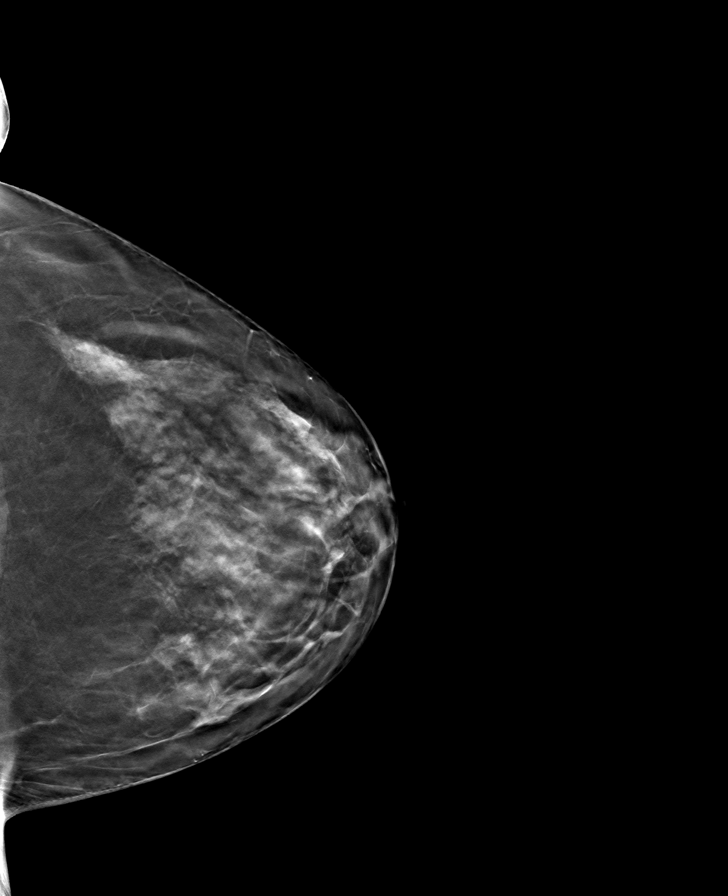

[R CC tomo · tomo slice 33/65.0]
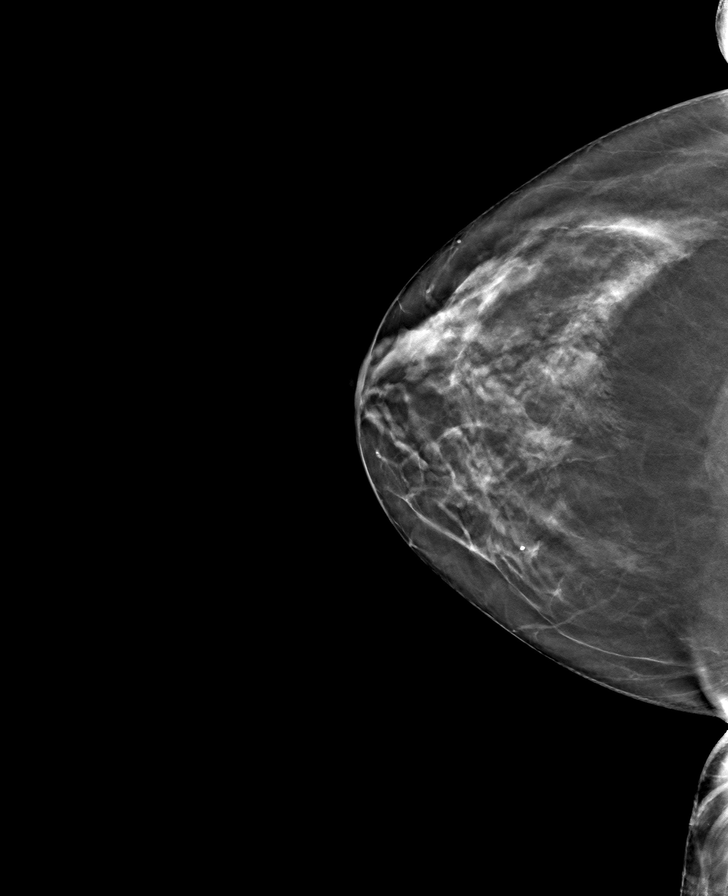

[R MLO tomo · tomo slice 35/69.0]
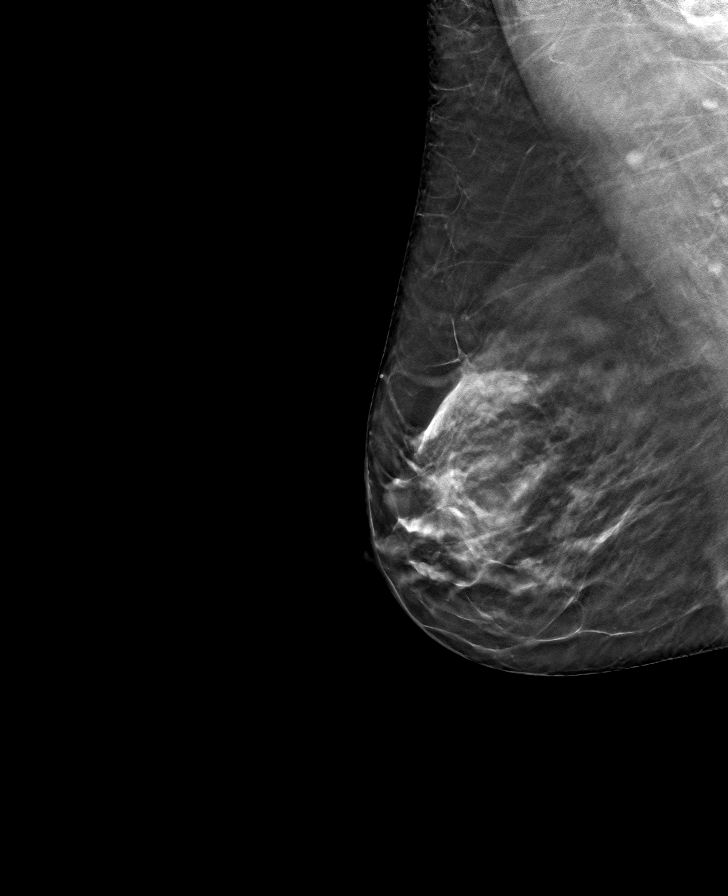

[L MLO tomo · tomo slice 33/64.0]
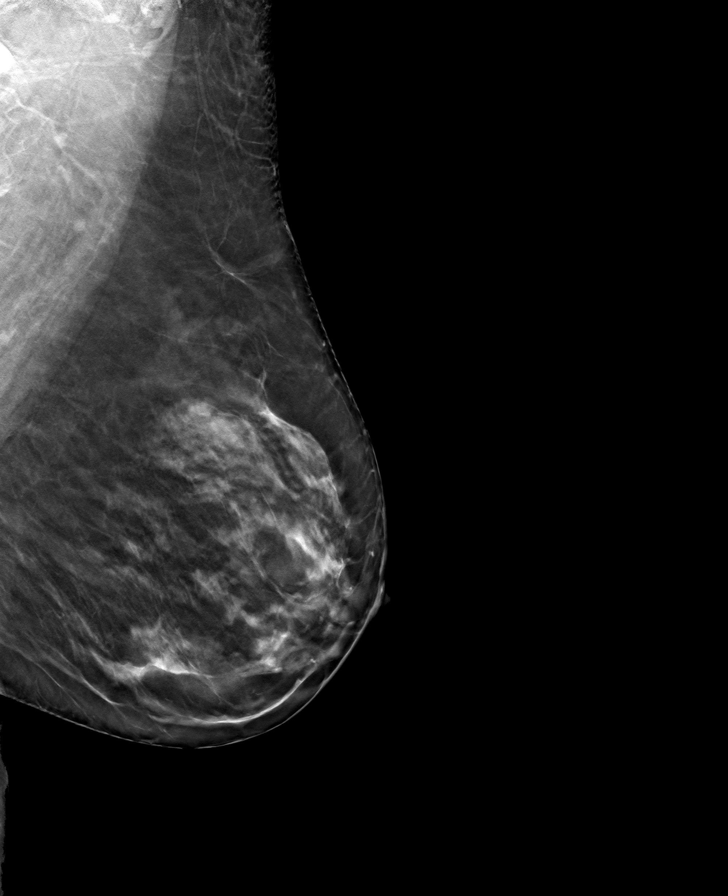

[8 of 24 positions shown; findings below may reference images not displayed]

ACR Breast Density Category c: The breast tissue is heterogeneously
dense, which may obscure small masses.
FINDINGS: There are no findings suspicious for malignancy. Images were
processed with CAD.
IMPRESSION: No mammographic evidence of malignancy. A result letter of this
screening mammogram will be mailed directly to the patient.

RECOMMENDATION:
Screening mammogram in one year. (Code:[id number]-U-LHB)

BI-RADS CATEGORY  1: Negative.

## 2020-10-31 ENCOUNTER — Other Ambulatory Visit: Payer: 59

## 2020-10-31 DIAGNOSIS — Z20822 Contact with and (suspected) exposure to covid-19: Secondary | ICD-10-CM | POA: Diagnosis not present

## 2020-11-01 LAB — SARS-COV-2, NAA 2 DAY TAT

## 2020-11-01 LAB — NOVEL CORONAVIRUS, NAA: SARS-CoV-2, NAA: NOT DETECTED

## 2020-12-20 ENCOUNTER — Other Ambulatory Visit: Payer: Self-pay

## 2020-12-20 ENCOUNTER — Ambulatory Visit (INDEPENDENT_AMBULATORY_CARE_PROVIDER_SITE_OTHER): Payer: Self-pay | Admitting: *Deleted

## 2020-12-20 ENCOUNTER — Other Ambulatory Visit (HOSPITAL_COMMUNITY): Payer: Self-pay | Admitting: Nurse Practitioner

## 2020-12-20 VITALS — Ht 70.0 in | Wt 191.0 lb

## 2020-12-20 DIAGNOSIS — Z1211 Encounter for screening for malignant neoplasm of colon: Secondary | ICD-10-CM

## 2020-12-20 MED ORDER — CLENPIQ 10-3.5-12 MG-GM -GM/160ML PO SOLN: 1.0000 | Freq: Once | ORAL | 0 refills | Status: AC

## 2020-12-20 MED FILL — CLENPIQ 10-3.5-12 MG-GM -GM: 10-3.5-12 M | 1 days supply | Qty: 320 | Fill #0

## 2020-12-20 NOTE — Progress Notes (Addendum)
Gastroenterology Pre-Procedure Review  Request Date: 12/20/2020 Requesting Physician: 5 year recall, Was due 2019, Last TCS 05/31/2013 done by Dr. Gala Romney, normal colon, family hx of colon cancer (mom and uncle)  PATIENT REVIEW QUESTIONS: The patient responded to the following health history questions as indicated:    1. Diabetes Melitis: no 2. Joint replacements in the past 12 months: no 3. Major health problems in the past 3 months: no 4. Has an artificial valve or MVP: no 5. Has a defibrillator: no 6. Has been advised in past to take antibiotics in advance of a procedure like teeth cleaning: no 7. Family history of colon cancer: yes, mother: age 20, uncle: age unknown 4. Alcohol Use: yes, 1 glass of wine a week 9. Illicit drug Use: no 10. History of sleep apnea: no  11. History of coronary artery or other vascular stents placed within the last 12 months: no 12. History of any prior anesthesia complications: no 13. Body mass index is 27.41 kg/m.    MEDICATIONS & ALLERGIES:    Patient reports the following regarding taking any blood thinners:   Plavix? no Aspirin? yes Coumadin? no Brilinta? no Xarelto? no Eliquis? no Pradaxa? no Savaysa? no Effient? no  Patient confirms/reports the following medications:  Current Outpatient Medications  Medication Sig Dispense Refill  . aspirin 81 MG tablet Take 81 mg by mouth daily.    . Calcium Carbonate-Vit D-Min (CALCIUM 1200 PO) Take by mouth daily.    . cetirizine (ZYRTEC) 5 MG tablet Take 10 mg by mouth daily.    . Cholecalciferol (EQL VITAMIN D3) 50 MCG (2000 UT) CAPS Take by mouth daily.    . Melatonin 5 MG CAPS Take by mouth at bedtime.    . Multiple Vitamin (MULTIVITAMIN) capsule Take 1 capsule by mouth daily.     No current facility-administered medications for this visit.    Patient confirms/reports the following allergies:  No Known Allergies  No orders of the defined types were placed in this encounter.   AUTHORIZATION  INFORMATION Primary Insurance: [employer] [place] ,  [place] #:[id number],  Group #: [id number] Pre-Cert / Josem Kaufmann required: No, not required per [place] / Auth #: [id number]  SCHEDULE INFORMATION: Procedure has been scheduled as follows:  Date: 02/07/2021, Time: AM procedure Location: [facility] with Dr. Gala Romney  This Gastroenterology Pre-Precedure Review Form is being routed to the following provider(s): Walden Field, NP

## 2020-12-20 NOTE — Patient Instructions (Addendum)
Reminder Covid screening 02/05/2021 at 3:45 pm.  [place]   Patient Name:  Gabrielle Chase Date of procedure:  02/07/2021 Time to register at [place] Stay: You will receive a call from the hospital a few days before your procedure. Provider:  Dr. Gala Romney  Please notify us immediately if you are diabetic, take iron supplements, or if you are on coumadin or any blood thinners.  Please hold the following medications: n/a  Note: Do NOT refrigerate or freeze CLENPIQ. CLENPIQ is ready to drink. There is no need to add any other liquid or mix the medicine in the bottle before you start dosing.   02/06/2021  Tuesday-  1 Day prior to procedure:     CLEAR LIQUIDS ALL DAY--NO SOLID FOODS OR DAIRY PRODUCTS! See list of liquids that are allowed and items that are NOT allowed below.   Diabetic Medication Instructions:  n/a   You must drink plenty of CLEAR LIQUIDS starting before your bowel prep. It is important to stay adequately hydrated before, during, and after your bowel prep for the prep to work effectively!   At 4:00 PM Begin the prep as follows:    1. Drink one bottle of pre-mixed CLENPIQ right from the bottle.  2. Drink at least five (5) 8-ounce drinks of clear liquids of your choice within the next 5 hours   At 10:00 PM: 1. Drink the second bottle of pre-mixed CLENPIQ right from the bottle.   2. Drink at least three (3) 8-ounce drinks of clear liquids of your choice within the next 3 hours before going to bed.   Continue clear liquids until midnight.  Nothing to eat or drink after midnight.  02/07/2021 Wednesday- Day of Procedure:   Diabetic medications adjustments: n/a   You may take TYLENOL products.  Please continue your regular medications unless we have instructed you otherwise.     Please note, on the day of your procedure you MUST be accompanied by an adult who is willing to assume responsibility for you at time of discharge. If you do not have  such person with you, your procedure will have to be rescheduled.                                                                                                                     Please leave ALL jewelry at home prior to coming to the hospital for your procedure.   *It is your responsibility to check with your insurance company for the benefits of coverage you have for this procedure. Unfortunately, not all insurance companies have benefits to cover all or part of these types of procedures. It is your responsibility to check your benefits, however we will be glad to assist you with any codes your insurance company may need.   Please note that most insurance companies will not cover a screening colonoscopy for people under the age of 55  For example, with some insurance companies you may have benefits for a screening colonoscopy,  but if polyps are found the diagnosis will change and then you may have a deductible that will need to be met. Please make sure you check your benefits for screening colonoscopy as well as a diagnostic colonoscopy.    CLEAR LIQUIDS: (NO RED or PURPLE) Water  Jello   Apple Juice  White Grape Juice   Kool-Aid Soft drinks  Banana popsicles Sports Drink  Black coffee (No cream or milk) Tea (No cream or milk)  Broth (fat free beef/chicken/vegetable)  Clear liquids allow you to see your fingers on the other side of the glass.  Be sure they are NOT RED or PURPLE in color, cloudy, but CLEAR.  Do Not Eat: Dairy products of any kind Cranberry juice Tomato or V8 Juice  Orange Juice   Grapefruit Juice Red Grape Juice Alcohol   Non-dairy creamer Solid foods like cereal, oatmeal, yogurt, fruits, vegetables, creamed soups, eggs, bread, etc   HELPFUL HINTS TO MAKE DRINKING EASIER: -Trying drinking through a straw. -If you become nauseated, try consuming smaller amounts or stretch out the time between glasses.  Stop for 30 minutes & slowly start back drinking.  Call our  office with any questions or concerns at [phone].  Thank You,  Christ Kick, [place]

## 2020-12-22 NOTE — Progress Notes (Signed)
Ok to schedule.  ASA I/II 

## 2020-12-27 ENCOUNTER — Other Ambulatory Visit: Payer: Self-pay | Admitting: *Deleted

## 2021-01-18 DIAGNOSIS — Z13220 Encounter for screening for lipoid disorders: Secondary | ICD-10-CM | POA: Diagnosis not present

## 2021-01-18 DIAGNOSIS — Z0000 Encounter for general adult medical examination without abnormal findings: Secondary | ICD-10-CM | POA: Diagnosis not present

## 2021-02-05 ENCOUNTER — Other Ambulatory Visit (HOSPITAL_COMMUNITY)
Admission: RE | Admit: 2021-02-05 | Discharge: 2021-02-05 | Disposition: A | Discharge status: Home / Self Care | Payer: 59 | Source: Ambulatory Visit | Attending: Internal Medicine | Admitting: Internal Medicine

## 2021-02-05 ENCOUNTER — Other Ambulatory Visit: Payer: Self-pay

## 2021-02-05 DIAGNOSIS — Z01812 Encounter for preprocedural laboratory examination: Secondary | ICD-10-CM | POA: Diagnosis present

## 2021-02-05 DIAGNOSIS — Z20822 Contact with and (suspected) exposure to covid-19: Secondary | ICD-10-CM | POA: Insufficient documentation

## 2021-02-06 LAB — SARS CORONAVIRUS 2 (TAT 6-24 HRS): SARS Coronavirus 2: NEGATIVE

## 2021-02-07 ENCOUNTER — Ambulatory Visit (HOSPITAL_COMMUNITY)
Admission: RE | Admit: 2021-02-07 | Discharge: 2021-02-07 | Disposition: A | Discharge status: Home / Self Care | Payer: 59 | Attending: Internal Medicine | Admitting: Internal Medicine

## 2021-02-07 ENCOUNTER — Other Ambulatory Visit: Payer: Self-pay

## 2021-02-07 ENCOUNTER — Encounter (HOSPITAL_COMMUNITY): Payer: Self-pay | Admitting: Internal Medicine

## 2021-02-07 ENCOUNTER — Encounter (HOSPITAL_COMMUNITY)
Admission: RE | Disposition: A | Discharge status: Home / Self Care | Payer: Self-pay | Source: Home / Self Care | Attending: Internal Medicine

## 2021-02-07 DIAGNOSIS — M353 Polymyalgia rheumatica: Secondary | ICD-10-CM | POA: Insufficient documentation

## 2021-02-07 DIAGNOSIS — Z803 Family history of malignant neoplasm of breast: Secondary | ICD-10-CM | POA: Diagnosis not present

## 2021-02-07 DIAGNOSIS — Z1211 Encounter for screening for malignant neoplasm of colon: Secondary | ICD-10-CM | POA: Diagnosis not present

## 2021-02-07 DIAGNOSIS — Z7982 Long term (current) use of aspirin: Secondary | ICD-10-CM | POA: Insufficient documentation

## 2021-02-07 DIAGNOSIS — Z87891 Personal history of nicotine dependence: Secondary | ICD-10-CM | POA: Insufficient documentation

## 2021-02-07 DIAGNOSIS — Z79899 Other long term (current) drug therapy: Secondary | ICD-10-CM | POA: Insufficient documentation

## 2021-02-07 DIAGNOSIS — Z800 Family history of malignant neoplasm of digestive organs: Secondary | ICD-10-CM | POA: Insufficient documentation

## 2021-02-07 HISTORY — PX: COLONOSCOPY: SHX5424

## 2021-02-07 SURGERY — COLONOSCOPY
Anesthesia: Moderate Sedation

## 2021-02-07 MED ORDER — STERILE WATER FOR IRRIGATION IR SOLN
Status: DC | PRN
  Administered 2021-02-07: 1.5 mL

## 2021-02-07 MED ORDER — MEPERIDINE HCL 100 MG/ML IJ SOLN
INTRAMUSCULAR | Status: DC | PRN
  Administered 2021-02-07: 10 mg via INTRAVENOUS
  Administered 2021-02-07: 40 mg via INTRAVENOUS

## 2021-02-07 MED ORDER — ONDANSETRON HCL 4 MG/2ML IJ SOLN
INTRAMUSCULAR | Status: DC | PRN
  Administered 2021-02-07: 4 mg via INTRAVENOUS

## 2021-02-07 MED ORDER — SODIUM CHLORIDE 0.9 % IV SOLN: INTRAVENOUS | Status: DC

## 2021-02-07 MED ORDER — MEPERIDINE HCL 50 MG/ML IJ SOLN
INTRAMUSCULAR | Status: AC
  Filled 2021-02-07: qty 1

## 2021-02-07 MED ORDER — MIDAZOLAM HCL 5 MG/5ML IJ SOLN
INTRAMUSCULAR | Status: AC
  Filled 2021-02-07: qty 10

## 2021-02-07 MED ORDER — ONDANSETRON HCL 4 MG/2ML IJ SOLN
INTRAMUSCULAR | Status: AC
  Filled 2021-02-07: qty 2

## 2021-02-07 MED ORDER — MIDAZOLAM HCL 5 MG/5ML IJ SOLN
INTRAMUSCULAR | Status: DC | PRN
  Administered 2021-02-07: 2 mg via INTRAVENOUS
  Administered 2021-02-07 (×6): 1 mg via INTRAVENOUS

## 2021-02-07 NOTE — Op Note (Signed)
[facility] Patient Name: Gabrielle Chase Procedure Date: 02/07/2021 7:12 AM MRN: [id number] Date of Birth: 1961-04-30 Attending MD: Norvel Richards , MD CSN: [id number] Age: 60 Admit Type: Outpatient Procedure:                Colonoscopy Indications:              Screening in patient at increased risk: Family                            history of 1st-degree relative with colorectal                            cancer Providers:                Norvel Richards, MD, Caprice Kluver, Raphael Gibney, Technician Referring MD:              Medicines:                Midazolam mg IV Complications:            No immediate complications. Estimated Blood Loss:     Estimated blood loss: none. Procedure:                Pre-Anesthesia Assessment:                           - Prior to the procedure, a History and Physical                            was performed, and patient medications and                            allergies were reviewed. The patient's tolerance of                            previous anesthesia was also reviewed. The risks                            and benefits of the procedure and the sedation                            options and risks were discussed with the patient.                            All questions were answered, and informed consent                            was obtained. Prior Anticoagulants: The patient has                            taken no previous anticoagulant or antiplatelet                            agents. ASA Grade Assessment: II -  A patient with                            mild systemic disease. After reviewing the risks                            and benefits, the patient was deemed in                            satisfactory condition to undergo the procedure.                           After obtaining informed consent, the colonoscope                            was passed under direct vision. Throughout the                             procedure, the patient's blood pressure, pulse, and                            oxygen saturations were monitored continuously. The                            CF-HQ190L ([id number]) scope was introduced through                            the anus and advanced to the the cecum, identified                            by appendiceal orifice and ileocecal valve. The                            colonoscopy was performed without difficulty. The                            patient tolerated the procedure well. The quality                            of the bowel preparation was adequate. Scope In: 7:48:24 AM Scope Out: 8:19:40 AM Scope Withdrawal Time: 0 hours 8 minutes 21 seconds  Total Procedure Duration: 0 hours 31 minutes 16 seconds  Findings:      The perianal and digital rectal examinations were normal. Noncompliant       left colon. Withdrew the adult scope and obtained the pediatric       colonoscope and was able to make it into the right colon to the cecum.       The more proximal colon was relatively redundant requiring external       abdominal pressure and changing the patient's position to reach the       cecum.      The entire examined colon appeared normal. Rectal vault too small to       retroflex even with a pediatric colonoscope. However, rectal mucosa seen       well on face. Impression:               -  The entire examined colon is normal (noncompliant                            and redundant).                           - No specimens collected. Moderate Sedation:      Moderate (conscious) sedation was administered by the endoscopy nurse       and supervised by the endoscopist. The following parameters were       monitored: oxygen saturation, heart rate, blood pressure, respiratory       rate, EKG, adequacy of pulmonary ventilation, and response to care.       Total physician intraservice time was 37 minutes. Recommendation:           - Patient has a contact number  available for                            emergencies. The signs and symptoms of potential                            delayed complications were discussed with the                            patient. Return to normal activities tomorrow.                            Written discharge instructions were provided to the                            patient.                           - Advance diet as tolerated. Repeat colonoscopy in                            5 years. Patient will be best served utilizing                            propofol and a pediatric colonoscope. Procedure Code(s):        --- Professional ---                           [phone], Colonoscopy, flexible; diagnostic, including                            collection of specimen(s) by brushing or washing,                            when performed (separate procedure)                           99153, Moderate sedation; each additional 15                            minutes intraservice time  G0500, Moderate sedation services provided by the                            same physician or other qualified health care                            professional performing a gastrointestinal                            endoscopic service that sedation supports,                            requiring the presence of an independent trained                            observer to assist in the monitoring of the                            patient's level of consciousness and physiological                            status; initial 15 minutes of intra-service time;                            patient age 34 years or older (additional time may                            be reported with [phone], as appropriate) Diagnosis Code(s):        --- Professional ---                           Z80.0, Family history of malignant neoplasm of                            digestive organs CPT copyright 2019 American Medical Association. All rights  reserved. The codes documented in this report are preliminary and upon coder review may  be revised to meet current compliance requirements. Cristopher Estimable. Jossette Zirbel, MD Norvel Richards, MD 02/07/2021 8:31:51 AM This report has been signed electronically. Number of Addenda: 0

## 2021-02-07 NOTE — H&P (Signed)
_0 @   Primary Care Physician:  Maury Dus, MD Primary Gastroenterologist:  Dr. Gala Romney  Pre-Procedure History & Physical: HPI:  Gabrielle Chase is a 60 y.o. female is here for a screening colonoscopy.  Family history of colon cancer in first-degree relative.  No bowel symptoms.  Negative colonoscopy 2014.  Past Medical History:  Diagnosis Date  . Polymyalgia rheumatica ([place])   . Seasonal allergies     Past Surgical History:  Procedure Laterality Date  . ABLATION ON ENDOMETRIOSIS  1993  . BREAST EXCISIONAL BIOPSY Left   . CESAREAN SECTION  1989  . COLONOSCOPY  03/30/2004   IWP:YKDXIP rectum.Somewhat noncompliant left colon/Otherwise, normal colonic mucosa  . COLONOSCOPY N/A 05/31/2013   Procedure: COLONOSCOPY;  Surgeon: Daneil Dolin, MD;  Location: [facility];  Service: Endoscopy;  Laterality: N/A;  8:45  . RIGHT OOPHORECTOMY  1982   endometriosis    Prior to Admission medications   Medication Sig Start Date End Date Taking? Authorizing Provider  aspirin EC 81 MG tablet Take 81 mg by mouth daily. Swallow whole.   Yes [provider]  calcium carbonate (OS-CAL) 600 MG TABS tablet Take 600 mg by mouth 2 (two) times daily with a meal.   Yes [provider]  cetirizine (ZYRTEC) 10 MG tablet Take 10 mg by mouth daily.   Yes [provider]  Cholecalciferol (EQL VITAMIN D3) 50 MCG (2000 UT) CAPS Take 2,000 Units by mouth daily.   Yes [provider]  Multiple Vitamin (MULTIVITAMIN) capsule Take 1 capsule by mouth daily.   Yes [provider]  Sod Picosulfate-Mag Ox-Cit Acd 10-3.5-12 MG-GM -GM/160ML SOLN USE AS DIRECTED 12/20/20 12/20/21 Yes Carlis Stable, NP    Allergies as of 12/25/2020  . (No Known Allergies)    Family History  Problem Relation Age of Onset  . Colon cancer Mother        diagnosed at age 18-60, deceased age 89  . Breast cancer Paternal Aunt   . BRCA 1/2 Cousin   . BRCA 1/2 Cousin     Social History    Socioeconomic History  . Marital status: Married    Spouse name: Not on file  . Number of children: 1  . Years of education: Not on file  . Highest education level: Not on file  Occupational History  . Occupation: RN/STAFF EDUCATION    Employer: [employer]  Tobacco Use  . Smoking status: Former Smoker    Packs/day: 0.25    Years: 0.50    Pack years: 0.12    Types: Cigarettes  . Smokeless tobacco: Former Network engineer and Sexual Activity  . Alcohol use: Yes    Comment: socially  . Drug use: No  . Sexual activity: Not on file  Other Topics Concern  . Not on file  Social History Narrative  . Not on file   Social Determinants of Health   Financial Resource Strain: Not on file  Food Insecurity: Not on file  Transportation Needs: Not on file  Physical Activity: Not on file  Stress: Not on file  Social Connections: Not on file  Intimate Partner Violence: Not on file    Review of Systems: See HPI, otherwise negative ROS  Physical Exam: BP (!) 161/75   Temp 98.3 F (36.8 C)   Ht _1  (1.778 m)   Wt 84.8 kg   SpO2 100%   BMI 26.83 kg/m  General:   Alert,  Well-developed, well-nourished, pleasant  and cooperative in NAD Mouth:  No deformity or lesions, dentition normal. Neck:  Supple; no masses or thyromegaly. Lungs:  Clear throughout to auscultation.   No wheezes, crackles, or rhonchi. No acute distress. Heart:  Regular rate and rhythm; no murmurs, clicks, rubs,  or gallops. Abdomen:  Soft, nontender and nondistended. No masses, hepatosplenomegaly or hernias noted. Normal bowel sounds, without guarding, and without rebound.   Impression/Plan: Gabrielle Chase is now here to undergo a screening colonoscopy.  High risk examination.  Risks, benefits, limitations, imponderables and alternatives regarding colonoscopy have been reviewed with the patient. Questions have been answered. All parties agreeable.     Notice:  This dictation was prepared with  Dragon dictation along with smaller phrase technology. Any transcriptional errors that result from this process are unintentional and may not be corrected upon review.

## 2021-02-07 NOTE — Discharge Instructions (Signed)
Colonoscopy Discharge Instructions  Read the instructions outlined below and refer to this sheet in the next few weeks. These discharge instructions provide you with general information on caring for yourself after you leave the hospital. Your doctor may also give you specific instructions. While your treatment has been planned according to the most current medical practices available, unavoidable complications occasionally occur. If you have any problems or questions after discharge, call Dr. Gala Romney at [phone]. ACTIVITY  You may resume your regular activity, but move at a slower pace for the next 24 hours.   Take frequent rest periods for the next 24 hours.   Walking will help get rid of the air and reduce the bloated feeling in your belly (abdomen).   No driving for 24 hours (because of the medicine (anesthesia) used during the test).    Do not sign any important legal documents or operate any machinery for 24 hours (because of the anesthesia used during the test).  NUTRITION  Drink plenty of fluids.   You may resume your normal diet as instructed by your doctor.   Begin with a light meal and progress to your normal diet. Heavy or fried foods are harder to digest and may make you feel sick to your stomach (nauseated).   Avoid alcoholic beverages for 24 hours or as instructed.  MEDICATIONS  You may resume your normal medications unless your doctor tells you otherwise.  WHAT YOU CAN EXPECT TODAY  Some feelings of bloating in the abdomen.   Passage of more gas than usual.   Spotting of blood in your stool or on the toilet paper.  IF YOU HAD POLYPS REMOVED DURING THE COLONOSCOPY:  No aspirin products for 7 days or as instructed.   No alcohol for 7 days or as instructed.   Eat a soft diet for the next 24 hours.  FINDING OUT THE RESULTS OF YOUR TEST Not all test results are available during your visit. If your test results are not back during the visit, make an appointment  with your caregiver to find out the results. Do not assume everything is normal if you have not heard from your caregiver or the medical facility. It is important for you to follow up on all of your test results.  SEEK IMMEDIATE MEDICAL ATTENTION IF:  You have more than a spotting of blood in your stool.   Your belly is swollen (abdominal distention).   You are nauseated or vomiting.   You have a temperature over 101.   You have abdominal pain or discomfort that is severe or gets worse throughout the day.   Your colon appeared normal today  I recommend you return in 5 years for repeat examination  At patient request, I called Sampson Si at [phone] -reviewed results and recommendations  PATIENT INSTRUCTIONS POST-ANESTHESIA  IMMEDIATELY FOLLOWING SURGERY:  Do not drive or operate machinery for the first twenty four hours after surgery.  Do not make any important decisions for twenty four hours after surgery or while taking narcotic pain medications or sedatives.  If you develop intractable nausea and vomiting or a severe headache please notify your doctor immediately.  FOLLOW-UP:  Please make an appointment with your surgeon as instructed. You do not need to follow up with anesthesia unless specifically instructed to do so.  WOUND CARE INSTRUCTIONS (if applicable):  Keep a dry clean dressing on the anesthesia/puncture wound site if there is drainage.  Once the wound has quit draining you may leave it open  to air.  Generally you should leave the bandage intact for twenty four hours unless there is drainage.  If the epidural site drains for more than 36-48 hours please call the anesthesia department.  QUESTIONS?:  Please feel free to call your physician or the hospital operator if you have any questions, and they will be happy to assist you.

## 2021-02-09 ENCOUNTER — Encounter (HOSPITAL_COMMUNITY): Payer: Self-pay | Admitting: Internal Medicine

## 2021-03-15 DIAGNOSIS — Z23 Encounter for immunization: Secondary | ICD-10-CM | POA: Diagnosis not present

## 2021-04-19 DIAGNOSIS — D485 Neoplasm of uncertain behavior of skin: Secondary | ICD-10-CM | POA: Diagnosis not present

## 2021-04-19 DIAGNOSIS — L820 Inflamed seborrheic keratosis: Secondary | ICD-10-CM | POA: Diagnosis not present

## 2021-04-19 DIAGNOSIS — L821 Other seborrheic keratosis: Secondary | ICD-10-CM | POA: Diagnosis not present

## 2021-05-21 ENCOUNTER — Other Ambulatory Visit (HOSPITAL_COMMUNITY): Payer: Self-pay

## 2021-05-21 MED ORDER — CARESTART COVID-19 HOME TEST VI KIT
PACK | 0 refills | Status: DC
  Filled 2021-05-21: qty 4, 4d supply, fill #0

## 2021-05-26 DIAGNOSIS — Z20822 Contact with and (suspected) exposure to covid-19: Secondary | ICD-10-CM | POA: Diagnosis not present

## 2021-06-07 DIAGNOSIS — L820 Inflamed seborrheic keratosis: Secondary | ICD-10-CM | POA: Diagnosis not present

## 2021-06-07 DIAGNOSIS — L814 Other melanin hyperpigmentation: Secondary | ICD-10-CM | POA: Diagnosis not present

## 2021-06-07 DIAGNOSIS — D485 Neoplasm of uncertain behavior of skin: Secondary | ICD-10-CM | POA: Diagnosis not present

## 2021-06-07 DIAGNOSIS — D2271 Melanocytic nevi of right lower limb, including hip: Secondary | ICD-10-CM | POA: Diagnosis not present

## 2021-06-07 DIAGNOSIS — L821 Other seborrheic keratosis: Secondary | ICD-10-CM | POA: Diagnosis not present

## 2021-06-07 DIAGNOSIS — D2261 Melanocytic nevi of right upper limb, including shoulder: Secondary | ICD-10-CM | POA: Diagnosis not present

## 2021-06-07 DIAGNOSIS — D2262 Melanocytic nevi of left upper limb, including shoulder: Secondary | ICD-10-CM | POA: Diagnosis not present

## 2021-06-07 DIAGNOSIS — D1801 Hemangioma of skin and subcutaneous tissue: Secondary | ICD-10-CM | POA: Diagnosis not present

## 2021-06-07 DIAGNOSIS — D225 Melanocytic nevi of trunk: Secondary | ICD-10-CM | POA: Diagnosis not present

## 2021-06-15 DIAGNOSIS — Z23 Encounter for immunization: Secondary | ICD-10-CM | POA: Diagnosis not present

## 2021-07-03 ENCOUNTER — Other Ambulatory Visit (HOSPITAL_COMMUNITY): Payer: Self-pay

## 2021-07-03 MED ORDER — CARESTART COVID-19 HOME TEST VI KIT
PACK | 0 refills | Status: DC
  Filled 2021-07-03: qty 4, 4d supply, fill #0

## 2021-07-04 ENCOUNTER — Other Ambulatory Visit: Payer: Self-pay | Admitting: Family Medicine

## 2021-07-04 DIAGNOSIS — Z1231 Encounter for screening mammogram for malignant neoplasm of breast: Secondary | ICD-10-CM

## 2021-07-26 DIAGNOSIS — H524 Presbyopia: Secondary | ICD-10-CM | POA: Diagnosis not present

## 2021-07-26 DIAGNOSIS — H43393 Other vitreous opacities, bilateral: Secondary | ICD-10-CM | POA: Diagnosis not present

## 2021-07-26 DIAGNOSIS — H52223 Regular astigmatism, bilateral: Secondary | ICD-10-CM | POA: Diagnosis not present

## 2021-08-14 ENCOUNTER — Other Ambulatory Visit: Payer: Self-pay

## 2021-08-14 ENCOUNTER — Ambulatory Visit
Admission: RE | Admit: 2021-08-14 | Discharge: 2021-08-14 | Disposition: A | Discharge status: Home / Self Care | Payer: 59 | Source: Ambulatory Visit | Attending: Family Medicine | Admitting: Family Medicine

## 2021-08-14 DIAGNOSIS — Z1231 Encounter for screening mammogram for malignant neoplasm of breast: Secondary | ICD-10-CM | POA: Diagnosis not present

## 2021-10-08 ENCOUNTER — Other Ambulatory Visit (HOSPITAL_COMMUNITY): Payer: Self-pay

## 2021-10-08 MED ORDER — CARESTART COVID-19 HOME TEST VI KIT
PACK | 0 refills | Status: DC
  Filled 2021-10-08: qty 4, 4d supply, fill #0

## 2021-11-19 ENCOUNTER — Other Ambulatory Visit (HOSPITAL_COMMUNITY): Payer: Self-pay

## 2021-11-19 MED ORDER — CARESTART COVID-19 HOME TEST VI KIT
PACK | 0 refills | Status: DC
  Filled 2021-11-19: qty 4, 4d supply, fill #0

## 2022-01-23 DIAGNOSIS — E2839 Other primary ovarian failure: Secondary | ICD-10-CM | POA: Diagnosis not present

## 2022-01-23 DIAGNOSIS — Z13220 Encounter for screening for lipoid disorders: Secondary | ICD-10-CM | POA: Diagnosis not present

## 2022-01-23 DIAGNOSIS — Z0000 Encounter for general adult medical examination without abnormal findings: Secondary | ICD-10-CM | POA: Diagnosis not present

## 2022-01-23 DIAGNOSIS — M85859 Other specified disorders of bone density and structure, unspecified thigh: Secondary | ICD-10-CM | POA: Diagnosis not present

## 2022-01-24 ENCOUNTER — Other Ambulatory Visit: Payer: Self-pay | Admitting: Family Medicine

## 2022-01-24 DIAGNOSIS — E2839 Other primary ovarian failure: Secondary | ICD-10-CM

## 2022-01-25 ENCOUNTER — Other Ambulatory Visit: Payer: Self-pay | Admitting: Family Medicine

## 2022-01-25 DIAGNOSIS — Z1231 Encounter for screening mammogram for malignant neoplasm of breast: Secondary | ICD-10-CM

## 2022-04-25 ENCOUNTER — Other Ambulatory Visit (HOSPITAL_COMMUNITY): Payer: Self-pay

## 2022-04-25 DIAGNOSIS — L820 Inflamed seborrheic keratosis: Secondary | ICD-10-CM | POA: Diagnosis not present

## 2022-04-25 DIAGNOSIS — L821 Other seborrheic keratosis: Secondary | ICD-10-CM | POA: Diagnosis not present

## 2022-04-25 MED ORDER — TRIAMCINOLONE ACETONIDE 0.1 % EX CREA
1.0000 | TOPICAL_CREAM | CUTANEOUS | 1 refills | Status: DC | PRN
  Filled 2022-04-25: qty 45, 30d supply, fill #0

## 2022-07-30 DIAGNOSIS — D2261 Melanocytic nevi of right upper limb, including shoulder: Secondary | ICD-10-CM | POA: Diagnosis not present

## 2022-07-30 DIAGNOSIS — L814 Other melanin hyperpigmentation: Secondary | ICD-10-CM | POA: Diagnosis not present

## 2022-07-30 DIAGNOSIS — L603 Nail dystrophy: Secondary | ICD-10-CM | POA: Diagnosis not present

## 2022-07-30 DIAGNOSIS — L821 Other seborrheic keratosis: Secondary | ICD-10-CM | POA: Diagnosis not present

## 2022-07-30 DIAGNOSIS — D225 Melanocytic nevi of trunk: Secondary | ICD-10-CM | POA: Diagnosis not present

## 2022-07-30 DIAGNOSIS — D2262 Melanocytic nevi of left upper limb, including shoulder: Secondary | ICD-10-CM | POA: Diagnosis not present

## 2022-08-15 ENCOUNTER — Ambulatory Visit
Admission: RE | Admit: 2022-08-15 | Discharge: 2022-08-15 | Disposition: A | Discharge status: Home / Self Care | Payer: 59 | Source: Ambulatory Visit | Attending: Family Medicine | Admitting: Family Medicine

## 2022-08-15 DIAGNOSIS — Z1231 Encounter for screening mammogram for malignant neoplasm of breast: Secondary | ICD-10-CM

## 2022-08-15 DIAGNOSIS — Z780 Asymptomatic menopausal state: Secondary | ICD-10-CM | POA: Diagnosis not present

## 2022-08-15 DIAGNOSIS — E2839 Other primary ovarian failure: Secondary | ICD-10-CM

## 2022-08-15 DIAGNOSIS — M85851 Other specified disorders of bone density and structure, right thigh: Secondary | ICD-10-CM | POA: Diagnosis not present

## 2022-09-23 ENCOUNTER — Other Ambulatory Visit (HOSPITAL_COMMUNITY): Payer: Self-pay

## 2022-09-23 MED ORDER — SODIUM FLUORIDE 1.1 % DT GEL
1.0000 | Freq: Every evening | DENTAL | 3 refills | Status: AC
  Filled 2022-09-23: qty 100, 30d supply, fill #0
  Filled 2023-04-29: qty 100, 30d supply, fill #1

## 2022-12-31 ENCOUNTER — Other Ambulatory Visit (HOSPITAL_COMMUNITY): Payer: Self-pay

## 2022-12-31 MED ORDER — SODIUM FLUORIDE 5000 PPM 1.1 % DT PSTE
PASTE | DENTAL | 3 refills | Status: DC
  Filled 2022-12-31: qty 100, 30d supply, fill #0

## 2023-01-29 DIAGNOSIS — Z23 Encounter for immunization: Secondary | ICD-10-CM | POA: Diagnosis not present

## 2023-01-29 DIAGNOSIS — E2839 Other primary ovarian failure: Secondary | ICD-10-CM | POA: Diagnosis not present

## 2023-01-29 DIAGNOSIS — Z0000 Encounter for general adult medical examination without abnormal findings: Secondary | ICD-10-CM | POA: Diagnosis not present

## 2023-01-29 DIAGNOSIS — M8588 Other specified disorders of bone density and structure, other site: Secondary | ICD-10-CM | POA: Diagnosis not present

## 2023-01-29 DIAGNOSIS — Z13220 Encounter for screening for lipoid disorders: Secondary | ICD-10-CM | POA: Diagnosis not present

## 2023-02-19 ENCOUNTER — Encounter: Payer: 59 | Admitting: Obstetrics & Gynecology

## 2023-04-09 ENCOUNTER — Other Ambulatory Visit (HOSPITAL_COMMUNITY)
Admission: RE | Admit: 2023-04-09 | Discharge: 2023-04-09 | Disposition: A | Discharge status: Home / Self Care | Payer: 59 | Source: Ambulatory Visit | Attending: Obstetrics & Gynecology | Admitting: Obstetrics & Gynecology

## 2023-04-09 ENCOUNTER — Ambulatory Visit (INDEPENDENT_AMBULATORY_CARE_PROVIDER_SITE_OTHER): Payer: 59 | Admitting: Obstetrics & Gynecology

## 2023-04-09 ENCOUNTER — Encounter: Payer: Self-pay | Admitting: Obstetrics & Gynecology

## 2023-04-09 VITALS — BP 125/85 | HR 86 | Ht 70.0 in | Wt 195.6 lb

## 2023-04-09 DIAGNOSIS — Z01419 Encounter for gynecological examination (general) (routine) without abnormal findings: Secondary | ICD-10-CM | POA: Diagnosis not present

## 2023-04-09 NOTE — Progress Notes (Signed)
   WELL-WOMAN EXAMINATION Patient name: Gabrielle Chase MRN [id number]  Date of birth: 11-26-1960 Chief Complaint:   Gynecologic Exam (Last pap 5 years ago. )  History of Present Illness:   Gabrielle Chase is a 62 y.o. G3P1011 PM female being seen today for a routine well-woman exam.   Today she notes no acute complaints or concerns Previously seen in by Dr. Ambrose Mantle, per patient she thinks her last Pap was at least 5 years ago  No LMP recorded. Patient is postmenopausal.   Last pap not sure.  Last mammogram: 07/2022. Last colonoscopy: 2022     04/09/2023   10:14 AM  Depression screen PHQ 2/9  Decreased Interest 0  Down, Depressed, Hopeless 0  PHQ - 2 Score 0  Altered sleeping 0  Tired, decreased energy 0  Change in appetite 0  Feeling bad or failure about yourself  0  Trouble concentrating 0  Moving slowly or fidgety/restless 0  Suicidal thoughts 0  PHQ-9 Score 0      Review of Systems:   Pertinent items are noted in HPI Denies any headaches, blurred vision, fatigue, shortness of breath, chest pain, abdominal pain, bowel movements, urination, or intercourse unless otherwise stated above.  Pertinent History Reviewed:  Reviewed past medical,surgical, social and family history.  Reviewed problem list, medications and allergies. Physical Assessment:   Vitals:   04/09/23 1015  BP: 125/85  Pulse: 86  Weight: 195 lb 9.6 oz (88.7 kg)  Height: 5\' 10"  (1.778 m)  Body mass index is 28.07 kg/m.        Physical Examination:   General appearance - well appearing, and in no distress  Mental status - alert, oriented to person, place, and time  Psych:  She has a normal mood and affect  Skin - warm and dry, normal color, no suspicious lesions noted  Chest - effort normal, all lung fields clear to auscultation bilaterally  Heart - normal rate and regular rhythm  Neck:  midline trachea, no thyromegaly or nodules  Breasts - breasts appear normal, no suspicious masses, no  skin or nipple changes or  axillary nodes  Abdomen - soft, nontender, nondistended, no masses or organomegaly  Pelvic - VULVA: normal appearing vulva with no masses, tenderness or lesions  VAGINA: normal appearing vagina with normal color and discharge, no lesions  CERVIX: normal appearing cervix without discharge or lesions, no CMT  Thin prep pap is done with HR HPV cotesting  UTERUS: uterus is felt to be normal size, shape, consistency and nontender   ADNEXA: No adnexal masses or tenderness noted.  Extremities:  No swelling or varicosities noted  Chaperone: Faith Rogue     Assessment & Plan:  1) Well-Woman Exam -pap collected, reviewed ASCCP guidelines -mammogram and colonoscopy up to date   Meds: No orders of the defined types were placed in this encounter.   Follow-up: Return in about 2 years (around 04/08/2025) for annual, [] records from Physician for [facility].   Myna Hidalgo, DO Attending Obstetrician & Gynecologist, [facility] for [employer], [facility]

## 2023-04-10 LAB — CYTOLOGY - PAP
Comment: NEGATIVE
Diagnosis: NEGATIVE
High risk HPV: NEGATIVE

## 2023-04-15 DIAGNOSIS — H43393 Other vitreous opacities, bilateral: Secondary | ICD-10-CM | POA: Diagnosis not present

## 2023-04-15 DIAGNOSIS — H524 Presbyopia: Secondary | ICD-10-CM | POA: Diagnosis not present

## 2023-04-15 DIAGNOSIS — H52223 Regular astigmatism, bilateral: Secondary | ICD-10-CM | POA: Diagnosis not present

## 2023-04-29 ENCOUNTER — Other Ambulatory Visit: Payer: Self-pay

## 2023-04-30 ENCOUNTER — Other Ambulatory Visit (HOSPITAL_COMMUNITY): Payer: Self-pay

## 2023-06-25 ENCOUNTER — Other Ambulatory Visit (HOSPITAL_COMMUNITY): Payer: Self-pay

## 2023-06-26 ENCOUNTER — Other Ambulatory Visit (HOSPITAL_COMMUNITY): Payer: Self-pay

## 2023-08-05 ENCOUNTER — Other Ambulatory Visit: Payer: Self-pay | Admitting: Family Medicine

## 2023-08-05 DIAGNOSIS — Z1231 Encounter for screening mammogram for malignant neoplasm of breast: Secondary | ICD-10-CM

## 2023-08-06 ENCOUNTER — Other Ambulatory Visit (HOSPITAL_COMMUNITY): Payer: Self-pay

## 2023-08-11 DIAGNOSIS — J0140 Acute pansinusitis, unspecified: Secondary | ICD-10-CM | POA: Diagnosis not present

## 2023-08-15 DIAGNOSIS — J019 Acute sinusitis, unspecified: Secondary | ICD-10-CM | POA: Diagnosis not present

## 2023-08-18 ENCOUNTER — Ambulatory Visit: Payer: 59

## 2023-09-05 ENCOUNTER — Ambulatory Visit
Admission: RE | Admit: 2023-09-05 | Discharge: 2023-09-05 | Disposition: A | Discharge status: Home / Self Care | Payer: 59 | Source: Ambulatory Visit | Attending: Family Medicine | Admitting: Family Medicine

## 2023-09-05 DIAGNOSIS — Z1231 Encounter for screening mammogram for malignant neoplasm of breast: Secondary | ICD-10-CM

## 2024-02-23 ENCOUNTER — Other Ambulatory Visit: Payer: Self-pay | Admitting: Family Medicine

## 2024-02-23 DIAGNOSIS — E2839 Other primary ovarian failure: Secondary | ICD-10-CM

## 2024-03-01 ENCOUNTER — Other Ambulatory Visit: Payer: Self-pay | Admitting: Family Medicine

## 2024-03-01 DIAGNOSIS — Z1231 Encounter for screening mammogram for malignant neoplasm of breast: Secondary | ICD-10-CM

## 2024-03-19 ENCOUNTER — Encounter (HOSPITAL_COMMUNITY): Payer: Self-pay

## 2024-05-13 ENCOUNTER — Encounter (HOSPITAL_COMMUNITY): Payer: Self-pay | Admitting: Family Medicine

## 2024-05-20 ENCOUNTER — Other Ambulatory Visit (HOSPITAL_COMMUNITY)

## 2024-09-06 ENCOUNTER — Other Ambulatory Visit (HOSPITAL_COMMUNITY): Payer: Self-pay

## 2024-09-06 ENCOUNTER — Ambulatory Visit (HOSPITAL_COMMUNITY)

## 2024-09-07 ENCOUNTER — Ambulatory Visit

## 2024-09-08 ENCOUNTER — Ambulatory Visit (HOSPITAL_COMMUNITY)
Admission: RE | Admit: 2024-09-08 | Discharge: 2024-09-08 | Disposition: A | Payer: Self-pay | Source: Ambulatory Visit | Attending: Family Medicine | Admitting: Family Medicine

## 2024-09-08 ENCOUNTER — Encounter (HOSPITAL_COMMUNITY): Payer: Self-pay

## 2024-09-08 ENCOUNTER — Ambulatory Visit (HOSPITAL_COMMUNITY)
Admission: RE | Admit: 2024-09-08 | Discharge: 2024-09-08 | Disposition: A | Discharge status: Home / Self Care | Payer: Self-pay | Source: Ambulatory Visit | Attending: Family Medicine | Admitting: Family Medicine

## 2024-09-08 DIAGNOSIS — E2839 Other primary ovarian failure: Secondary | ICD-10-CM | POA: Insufficient documentation

## 2024-09-08 DIAGNOSIS — Z1231 Encounter for screening mammogram for malignant neoplasm of breast: Secondary | ICD-10-CM | POA: Insufficient documentation

## 2024-09-13 ENCOUNTER — Encounter (HOSPITAL_COMMUNITY): Payer: Self-pay | Admitting: Family Medicine

## 2024-09-15 ENCOUNTER — Other Ambulatory Visit (HOSPITAL_COMMUNITY): Payer: Self-pay | Admitting: Family Medicine

## 2024-09-15 DIAGNOSIS — R928 Other abnormal and inconclusive findings on diagnostic imaging of breast: Secondary | ICD-10-CM

## 2024-09-21 ENCOUNTER — Ambulatory Visit (HOSPITAL_COMMUNITY)
Admission: RE | Admit: 2024-09-21 | Discharge: 2024-09-21 | Disposition: A | Source: Ambulatory Visit | Attending: Family Medicine | Admitting: Family Medicine

## 2024-09-21 ENCOUNTER — Other Ambulatory Visit (HOSPITAL_COMMUNITY): Payer: Self-pay | Admitting: Family Medicine

## 2024-09-21 ENCOUNTER — Encounter (HOSPITAL_COMMUNITY): Payer: Self-pay | Admitting: Family Medicine

## 2024-09-21 ENCOUNTER — Encounter (HOSPITAL_COMMUNITY): Payer: Self-pay

## 2024-09-21 DIAGNOSIS — R928 Other abnormal and inconclusive findings on diagnostic imaging of breast: Secondary | ICD-10-CM

## 2024-09-22 ENCOUNTER — Other Ambulatory Visit (HOSPITAL_COMMUNITY): Payer: Self-pay | Admitting: Family Medicine

## 2024-09-22 DIAGNOSIS — R921 Mammographic calcification found on diagnostic imaging of breast: Secondary | ICD-10-CM

## 2024-09-22 DIAGNOSIS — R928 Other abnormal and inconclusive findings on diagnostic imaging of breast: Secondary | ICD-10-CM

## 2024-09-27 ENCOUNTER — Ambulatory Visit
Admission: RE | Admit: 2024-09-27 | Discharge: 2024-09-27 | Disposition: A | Source: Ambulatory Visit | Attending: Family Medicine | Admitting: Family Medicine

## 2024-09-27 ENCOUNTER — Inpatient Hospital Stay: Admission: RE | Admit: 2024-09-27 | Discharge: 2024-09-27 | Attending: Family Medicine | Admitting: Family Medicine

## 2024-09-27 DIAGNOSIS — R928 Other abnormal and inconclusive findings on diagnostic imaging of breast: Secondary | ICD-10-CM

## 2024-09-27 DIAGNOSIS — R921 Mammographic calcification found on diagnostic imaging of breast: Secondary | ICD-10-CM

## 2024-09-28 LAB — SURGICAL PATHOLOGY

## 2024-11-18 ENCOUNTER — Other Ambulatory Visit
